# Patient Record
Sex: Female | Born: 1965 | Race: Black or African American | Hispanic: No | Marital: Single | Smoking: Never smoker
Health system: Southern US, Community
[De-identification: ages and names within clinical notes are randomized; demographics above are authoritative.]

## PROBLEM LIST (undated history)

## (undated) DIAGNOSIS — T7840XA Allergy, unspecified, initial encounter: Secondary | ICD-10-CM

## (undated) DIAGNOSIS — R519 Headache, unspecified: Secondary | ICD-10-CM

## (undated) DIAGNOSIS — R51 Headache: Principal | ICD-10-CM

## (undated) DIAGNOSIS — G8929 Other chronic pain: Secondary | ICD-10-CM

## (undated) DIAGNOSIS — J45909 Unspecified asthma, uncomplicated: Secondary | ICD-10-CM

## (undated) HISTORY — DX: Headache, unspecified: R51.9

## (undated) HISTORY — DX: Allergy, unspecified, initial encounter: T78.40XA

## (undated) HISTORY — PX: HERNIA REPAIR: SHX51

## (undated) HISTORY — DX: Headache: R51

## (undated) HISTORY — DX: Unspecified asthma, uncomplicated: J45.909

## (undated) HISTORY — PX: CHOLECYSTECTOMY: SHX55

## (undated) HISTORY — DX: Other chronic pain: G89.29

---

## 1998-04-30 ENCOUNTER — Other Ambulatory Visit: Admission: RE | Admit: 1998-04-30 | Discharge: 1998-04-30 | Payer: Self-pay | Admitting: Obstetrics and Gynecology

## 1998-08-26 ENCOUNTER — Encounter: Payer: Self-pay | Admitting: Emergency Medicine

## 1998-08-26 ENCOUNTER — Emergency Department (HOSPITAL_COMMUNITY): Admission: EM | Admit: 1998-08-26 | Discharge: 1998-08-26 | Payer: Self-pay | Admitting: Emergency Medicine

## 1998-09-10 ENCOUNTER — Emergency Department (HOSPITAL_COMMUNITY): Admission: EM | Admit: 1998-09-10 | Discharge: 1998-09-11 | Payer: Self-pay | Admitting: Emergency Medicine

## 1998-10-07 ENCOUNTER — Encounter: Payer: Self-pay | Admitting: *Deleted

## 1998-10-07 ENCOUNTER — Ambulatory Visit (HOSPITAL_COMMUNITY): Admission: RE | Admit: 1998-10-07 | Discharge: 1998-10-08 | Payer: Self-pay | Admitting: *Deleted

## 1999-05-09 ENCOUNTER — Other Ambulatory Visit: Admission: RE | Admit: 1999-05-09 | Discharge: 1999-05-09 | Payer: Self-pay | Admitting: Obstetrics and Gynecology

## 2001-07-07 ENCOUNTER — Other Ambulatory Visit: Admission: RE | Admit: 2001-07-07 | Discharge: 2001-07-07 | Payer: Self-pay | Admitting: Obstetrics and Gynecology

## 2009-04-05 LAB — HM PAP SMEAR: HM Pap smear: NORMAL

## 2009-04-05 LAB — HM MAMMOGRAPHY: HM Mammogram: NEGATIVE

## 2009-04-18 ENCOUNTER — Encounter: Admission: RE | Admit: 2009-04-18 | Discharge: 2009-04-18 | Payer: Self-pay | Admitting: Obstetrics and Gynecology

## 2010-08-12 ENCOUNTER — Inpatient Hospital Stay (INDEPENDENT_AMBULATORY_CARE_PROVIDER_SITE_OTHER)
Admission: RE | Admit: 2010-08-12 | Discharge: 2010-08-12 | Disposition: A | Discharge status: Home / Self Care | Payer: PRIVATE HEALTH INSURANCE | Source: Ambulatory Visit | Attending: Family Medicine | Admitting: Family Medicine

## 2010-08-12 DIAGNOSIS — R059 Cough, unspecified: Secondary | ICD-10-CM

## 2010-08-12 DIAGNOSIS — R05 Cough: Secondary | ICD-10-CM

## 2010-12-26 ENCOUNTER — Encounter: Payer: Self-pay | Admitting: Internal Medicine

## 2010-12-26 ENCOUNTER — Ambulatory Visit (INDEPENDENT_AMBULATORY_CARE_PROVIDER_SITE_OTHER): Payer: PRIVATE HEALTH INSURANCE | Admitting: Internal Medicine

## 2010-12-26 DIAGNOSIS — G8929 Other chronic pain: Secondary | ICD-10-CM | POA: Insufficient documentation

## 2010-12-26 DIAGNOSIS — R51 Headache: Secondary | ICD-10-CM

## 2010-12-26 DIAGNOSIS — J45909 Unspecified asthma, uncomplicated: Secondary | ICD-10-CM

## 2010-12-26 MED ORDER — HYDROCODONE-HOMATROPINE 5-1.5 MG/5ML PO SYRP: 5.0000 mL | ORAL_SOLUTION | Freq: Four times a day (QID) | ORAL | Status: AC | PRN

## 2010-12-26 NOTE — Progress Notes (Signed)
Subjective:    Patient ID: Morgan Smith, female    DOB: 1965-07-09, 45 y.o.   MRN: [id number]  HPI  45 year old patient who is seen today to establish with our practice. She enjoys excellent health. She does have a history of migraine headaches that are generally well controlled with ibuprofen. She also has a history of allergic rhinitis. No history of tobacco use. She does have a history of scoliosis and surgery in 1979. In 1986 she underwent surgery for scar revision. She's had 2 hernia surgeries as an infant. She is status post cholecystectomy in 2001 She was seen at the urgent care recently and treated with a prednisone Dosepak and antibiotic therapy for asthmatic bronchitis. A chest x-ray was normal. She has been scheduled to see an allergist in 3 days. Family history noncontributory details of her father's health unknown Mother  age 46 and in excellent health No siblings  Social history-married for 14 years presently and divorced. No children born and raised in [place]     Review of Systems  Constitutional: Negative for fever, appetite change, fatigue and unexpected weight change.  HENT: Negative for hearing loss, ear pain, nosebleeds, congestion, sore throat, mouth sores, trouble swallowing, neck stiffness, dental problem, voice change, sinus pressure and tinnitus.   Eyes: Negative for photophobia, pain, redness and visual disturbance.  Respiratory: Positive for cough. Negative for chest tightness and shortness of breath.   Cardiovascular: Negative for chest pain, palpitations and leg swelling.  Gastrointestinal: Negative for nausea, vomiting, abdominal pain, diarrhea, constipation, blood in stool, abdominal distention and rectal pain.  Genitourinary: Negative for dysuria, urgency, frequency, hematuria, flank pain, vaginal bleeding, vaginal discharge, difficulty urinating, genital sores, vaginal pain, menstrual problem and pelvic pain.  Musculoskeletal: Negative for back pain and  arthralgias.  Skin: Negative for rash.  Neurological: Negative for dizziness, syncope, speech difficulty, weakness, light-headedness, numbness and headaches.  Hematological: Negative for adenopathy. Does not bruise/bleed easily.  Psychiatric/Behavioral: Negative for suicidal ideas, behavioral problems, self-injury, dysphoric mood and agitation. The patient is not nervous/anxious.        Objective:   Physical Exam  Constitutional: She is oriented to person, place, and time. She appears well-developed and well-nourished.  HENT:  Head: Normocephalic and atraumatic.  Right Ear: External ear normal.  Left Ear: External ear normal.  Mouth/Throat: Oropharynx is clear and moist.  Eyes: Conjunctivae and EOM are normal.  Neck: Normal range of motion. Neck supple. No JVD present. No thyromegaly present.  Cardiovascular: Normal rate, regular rhythm, normal heart sounds and intact distal pulses.   No murmur heard. Pulmonary/Chest: Effort normal and breath sounds normal. She has no wheezes. She has no rales.       Slightly hoarse Chest is clear  Oxygen saturation 96%  Abdominal: Soft. Bowel sounds are normal. She exhibits no distension and no mass. There is no tenderness. There is no rebound and no guarding.  Musculoskeletal: Normal range of motion. She exhibits no edema and no tenderness.  Neurological: She is alert and oriented to person, place, and time. She has normal reflexes. No cranial nerve deficit. She exhibits normal muscle tone. Coordination normal.  Skin: Skin is warm and dry. No rash noted.  Psychiatric: She has a normal mood and affect. Her behavior is normal.          Assessment & Plan:  Resolving asthmatic bronchitis. Her most prominent symptom presently is cough we'll treat with an antitussives. She is scheduled for allergy followup in 3 days. Return here when necessary

## 2010-12-26 NOTE — Patient Instructions (Signed)
Use cough medicine as directed  Allergy followup as scheduled  Get plenty of rest, Drink lots of  clear liquids, and use Tylenol or ibuprofen for fever and discomfort.

## 2011-03-16 ENCOUNTER — Ambulatory Visit (INDEPENDENT_AMBULATORY_CARE_PROVIDER_SITE_OTHER): Payer: PRIVATE HEALTH INSURANCE | Admitting: Internal Medicine

## 2011-03-16 ENCOUNTER — Encounter: Payer: Self-pay | Admitting: Internal Medicine

## 2011-03-16 DIAGNOSIS — E559 Vitamin D deficiency, unspecified: Secondary | ICD-10-CM

## 2011-03-16 DIAGNOSIS — J45909 Unspecified asthma, uncomplicated: Secondary | ICD-10-CM

## 2011-03-16 MED ORDER — ERGOCALCIFEROL 1.25 MG (50000 UT) PO CAPS: 50000.0000 [IU] | ORAL_CAPSULE | ORAL | Status: AC

## 2011-03-16 NOTE — Progress Notes (Signed)
  Subjective:    Patient ID: Morgan Smith, female    DOB: [birth year], 45 y.o.   MRN: [id number]  HPI  45 year old patient who is seen today for followup she had laboratory studies performed that were to revealed a significant vitamin D deficiency. She is having some mild allergy related symptoms. She is followed by allergy and is being treated for chronic asthma. Denies any active wheezing. Laboratory studies were reviewed    Review of Systems  Constitutional: Negative.   HENT: Positive for congestion, rhinorrhea and postnasal drip. Negative for hearing loss, sore throat, dental problem, sinus pressure and tinnitus.   Eyes: Negative for pain, discharge and visual disturbance.  Respiratory: Negative for cough and shortness of breath.   Cardiovascular: Negative for chest pain, palpitations and leg swelling.  Gastrointestinal: Negative for nausea, vomiting, abdominal pain, diarrhea, constipation, blood in stool and abdominal distention.  Genitourinary: Negative for dysuria, urgency, frequency, hematuria, flank pain, vaginal bleeding, vaginal discharge, difficulty urinating, vaginal pain and pelvic pain.  Musculoskeletal: Negative for joint swelling, arthralgias and gait problem.  Skin: Negative for rash.  Neurological: Negative for dizziness, syncope, speech difficulty, weakness, numbness and headaches.  Hematological: Negative for adenopathy.  Psychiatric/Behavioral: Negative for behavioral problems, dysphoric mood and agitation. The patient is not nervous/anxious.        Objective:   Physical Exam  Constitutional: She is oriented to person, place, and time. She appears well-developed and well-nourished.  HENT:  Head: Normocephalic.  Right Ear: External ear normal.  Left Ear: External ear normal.  Mouth/Throat: Oropharynx is clear and moist.  Eyes: Conjunctivae and EOM are normal. Pupils are equal, round, and reactive to light.  Neck: Normal range of motion. Neck supple. No thyromegaly  present.  Cardiovascular: Normal rate, regular rhythm, normal heart sounds and intact distal pulses.   Pulmonary/Chest: Effort normal and breath sounds normal. No respiratory distress. She has no wheezes.       Chest is clear  Abdominal: Soft. Bowel sounds are normal. She exhibits no mass. There is no tenderness.  Musculoskeletal: Normal range of motion.  Lymphadenopathy:    She has no cervical adenopathy.  Neurological: She is alert and oriented to person, place, and time.  Skin: Skin is warm and dry. No rash noted.  Psychiatric: She has a normal mood and affect. Her behavior is normal.          Assessment & Plan:   Vitamin D deficiency. We'll treat with vitamin D 50,000 units weekly for 12 weeks then placed on daily vitamin D. She is asked to followup a vitamin D level in 4-6 months Asthma. Stable on Pulmicort

## 2011-03-16 NOTE — Patient Instructions (Signed)
Take a calcium supplement, plus 1500-2000  units of vitamin D    It is important that you exercise regularly, at least 20 minutes 3 to 4 times per week.  If you develop chest pain or shortness of breath seek  medical attention.

## 2014-02-04 ENCOUNTER — Ambulatory Visit (INDEPENDENT_AMBULATORY_CARE_PROVIDER_SITE_OTHER): Payer: PRIVATE HEALTH INSURANCE | Admitting: Family Medicine

## 2014-02-04 ENCOUNTER — Encounter: Payer: Self-pay | Admitting: Family Medicine

## 2014-02-04 ENCOUNTER — Ambulatory Visit (INDEPENDENT_AMBULATORY_CARE_PROVIDER_SITE_OTHER): Payer: PRIVATE HEALTH INSURANCE

## 2014-02-04 VITALS — BP 158/98 | HR 91 | Temp 98.0°F | Resp 18

## 2014-02-04 DIAGNOSIS — J4521 Mild intermittent asthma with (acute) exacerbation: Secondary | ICD-10-CM

## 2014-02-04 DIAGNOSIS — R05 Cough: Secondary | ICD-10-CM

## 2014-02-04 DIAGNOSIS — J45901 Unspecified asthma with (acute) exacerbation: Secondary | ICD-10-CM

## 2014-02-04 DIAGNOSIS — Z833 Family history of diabetes mellitus: Secondary | ICD-10-CM

## 2014-02-04 DIAGNOSIS — R059 Cough, unspecified: Secondary | ICD-10-CM

## 2014-02-04 DIAGNOSIS — R0602 Shortness of breath: Secondary | ICD-10-CM

## 2014-02-04 LAB — GLUCOSE, POCT (MANUAL RESULT ENTRY): POC Glucose: 100 mg/dl — AB (ref 70–99)

## 2014-02-04 MED ORDER — AZITHROMYCIN 250 MG PO TABS: ORAL_TABLET | ORAL | Status: DC

## 2014-02-04 MED ORDER — ALBUTEROL SULFATE (2.5 MG/3ML) 0.083% IN NEBU
2.5000 mg | INHALATION_SOLUTION | Freq: Once | RESPIRATORY_TRACT | Status: AC
  Administered 2014-02-04: 2.5 mg via RESPIRATORY_TRACT

## 2014-02-04 MED ORDER — PREDNISONE 20 MG PO TABS: 40.0000 mg | ORAL_TABLET | Freq: Every day | ORAL | Status: DC

## 2014-02-04 NOTE — Patient Instructions (Signed)
Start Zpak and prednisone for suspected asthmatic bronchitis with flair. Albuterol 1-2 puffs every 4-6 hours for wheezing, Can stop advair while you are taking the prednisone, then restart Advair twice per day everyday and recheck here or primary provider in few weeks to discuss medication regimen and possible spirometry (breathing test). Return to the clinic or go to the nearest emergency room if any of your symptoms worsen or new symptoms occur.  Acute Bronchitis Bronchitis is inflammation of the airways that extend from the windpipe into the lungs (bronchi). The inflammation often causes mucus to develop. This leads to a cough, which is the most common symptom of bronchitis.  In acute bronchitis, the condition usually develops suddenly and goes away over time, usually in a couple weeks. Smoking, allergies, and asthma can make bronchitis worse. Repeated episodes of bronchitis may cause further lung problems.  CAUSES Acute bronchitis is most often caused by the same virus that causes a cold. The virus can spread from person to person (contagious) through coughing, sneezing, and touching contaminated objects. SIGNS AND SYMPTOMS   Cough.   Fever.   Coughing up mucus.   Body aches.   Chest congestion.   Chills.   Shortness of breath.   Sore throat.  DIAGNOSIS  Acute bronchitis is usually diagnosed through a physical exam. Your health care provider will also ask you questions about your medical history. Tests, such as chest X-rays, are sometimes done to rule out other conditions.  TREATMENT  Acute bronchitis usually goes away in a couple weeks. Oftentimes, no medical treatment is necessary. Medicines are sometimes given for relief of fever or cough. Antibiotic medicines are usually not needed but may be prescribed in certain situations. In some cases, an inhaler may be recommended to help reduce shortness of breath and control the cough. A cool mist vaporizer may also be used to help  thin bronchial secretions and make it easier to clear the chest.  HOME CARE INSTRUCTIONS  Get plenty of rest.   Drink enough fluids to keep your urine clear or pale yellow (unless you have a medical condition that requires fluid restriction). Increasing fluids may help thin your respiratory secretions (sputum) and reduce chest congestion, and it will prevent dehydration.   Take medicines only as directed by your health care provider.  If you were prescribed an antibiotic medicine, finish it all even if you start to feel better.  Avoid smoking and secondhand smoke. Exposure to cigarette smoke or irritating chemicals will make bronchitis worse. If you are a smoker, consider using nicotine gum or skin patches to help control withdrawal symptoms. Quitting smoking will help your lungs heal faster.   Reduce the chances of another bout of acute bronchitis by washing your hands frequently, avoiding people with cold symptoms, and trying not to touch your hands to your mouth, nose, or eyes.   Keep all follow-up visits as directed by your health care provider.  SEEK MEDICAL CARE IF: Your symptoms do not improve after 1 week of treatment.  SEEK IMMEDIATE MEDICAL CARE IF:  You develop an increased fever or chills.   You have chest pain.   You have severe shortness of breath.  You have bloody sputum.   You develop dehydration.  You faint or repeatedly feel like you are going to pass out.  You develop repeated vomiting.  You develop a severe headache. MAKE SURE YOU:   Understand these instructions.  Will watch your condition.  Will get help right away if you are  not doing well or get worse. Document Released: 07/30/2004 Document Revised: 11/06/2013 Document Reviewed: 12/13/2012 [facility] Patient Information 2015 [place], [place]. This information is not intended to replace advice given to you by your health care provider. Make sure you discuss any questions you have with your health  care provider.

## 2014-02-04 NOTE — Progress Notes (Addendum)
This chart was scribed for Morgan StaggersJeffrey Nyisha Clippard, MD by Morgan Smith, ED Scribe. This patient was seen in room 7 and the patient's care was started at 2:29 PM. Subjective:    Patient ID: Morgan Smith, female    DOB: 10-May-1966, 48 y.o.   MRN: [id number]  Shortness of Breath   Chief Complaint  Patient presents with  . Shortness of Breath   HPI Comments: Morgan Smith is a 48 y.o. female who presents to the [facility] complaining of worsening SOB x 1 day ago. Pt pulled acutely from waiting room due to SOB and triager her pulled me to see pt due to breathing difficulties.  Pt states symptoms began 1 day ago with SOB, wheezing and a productive cough consisting clear mucous. Pt used advair 4 times yesterday with relief to symptoms. She reports gradually worsening symptom throughout the day today. She did not use inhaler trying to work through her symptoms. Pt denies night time symptoms throughout the week. Pt has been using advair for 4 year as needed. Pt uses this medication a couple times a week for SOB due to exertion while at work constantly running up and down stair.  Pt denies using albuterol inhaler yesterday. Pt does not use albuterol throughout the week.  Pt denies CP and heart problems. Pt denies leg swelling, abdominal pain and fever.Pt denies being a smoker.    Patient Active Problem List   Diagnosis Date Noted  . Vitamin D deficiency 03/16/2011  . Asthma 03/16/2011  . Chronic headaches 12/26/2010   Past Medical History  Diagnosis Date  . Chronic headaches   . Allergy    Past Surgical History  Procedure Laterality Date  . Cholecystectomy    . Hernia repair      infant   No Known Allergies Prior to Admission medications   Medication Sig Start Date End Date Taking? Authorizing Provider  ADVAIR DISKUS 250-50 MCG/DOSE AEPB Take 1 puff by mouth daily.  02/13/11   Historical Provider, MD  pantoprazole (PROTONIX) 40 MG tablet Take 40 mg by mouth daily.  02/12/11    Historical Provider, MD  PATANASE 0.6 % SOLN  12/29/10   Historical Provider, MD  PULMICORT FLEXHALER 180 MCG/ACT inhaler Inhale 2 puffs into the lungs 2 (two) times daily.  12/15/10   Historical Provider, MD  VENTOLIN HFA 108 (90 BASE) MCG/ACT inhaler Inhale 2 puffs into the lungs every 4 (four) hours as needed.  12/15/10   Historical Provider, MD   History   Social History  . Marital Status: Married    Spouse Name: N/A    Number of Children: N/A  . Years of Education: N/A   Occupational History  . Not on file.   Social History Main Topics  . Smoking status: Never Smoker   . Smokeless tobacco: Never Used  . Alcohol Use: Yes  . Drug Use: No  . Sexual Activity: Not on file   Other Topics Concern  . Not on file   Social History Narrative   Married 14 years they're presently divorced no children;  [place]    Review of Systems  Respiratory: Positive for shortness of breath.    Objective:   Physical Exam  Nursing note and vitals reviewed. Constitutional: She is oriented to person, place, and time. She appears well-developed and well-nourished. No distress.  HENT:  Head: Normocephalic and atraumatic.  Mouth/Throat: Oropharynx is clear and moist.  Edema over the turbinates bilaterally.  Eyes: Conjunctivae and EOM are normal.  Neck: Neck supple.  Cardiovascular: Normal rate.   Pulmonary/Chest: Effort normal. She has wheezes.  Distant appearing breath sounds with diffuse wheeze.   Musculoskeletal: Normal range of motion.  Neurological: She is alert and oriented to person, place, and time.  Skin: Skin is warm and dry.  Psychiatric: She has a normal mood and affect. Her behavior is normal.   Filed Vitals:   02/04/14 1428  BP: 158/98  Pulse: 91  Temp: 98 F (36.7 C)  TempSrc: Oral  Resp: 18  SpO2: 94%   3:27 PM- repeat exam after albuterol 2.5 neb. Improved aeration and work of breathing but still distant wheeze right greater than left with dyspnea  and tightness with cough.   [facility] reading (PRIMARY) by  Dr. Neva Seat: CXR: increased bronchial markings without discrete infiltrate.  Harrington rod in place.  Results for orders placed in visit on 02/04/14  GLUCOSE, POCT (MANUAL RESULT ENTRY)      Result Value Ref Range   POC Glucose 100 (*) 70 - 99 mg/dl    Assessment & Plan:   Morgan Smith is a 48 y.o. female Asthma, mild intermittent, with acute exacerbation - Plan: albuterol (PROVENTIL) (2.5 MG/3ML) 0.083% nebulizer solution 2.5 mg, DG Chest 2 View, albuterol (PROVENTIL) (2.5 MG/3ML) 0.083% nebulizer solution 2.5 mg  SOB (shortness of breath) - Plan: albuterol (PROVENTIL) (2.5 MG/3ML) 0.083% nebulizer solution 2.5 mg, DG Chest 2 View, albuterol (PROVENTIL) (2.5 MG/3ML) 0.083% nebulizer solution 2.5 mg  Cough - Plan: albuterol (PROVENTIL) (2.5 MG/3ML) 0.083% nebulizer solution 2.5 mg  Family history of type II diabetes mellitus - Plan: POCT glucose (manual entry)  Asthmatic bronchitis with acute exacerbation - Plan: predniSONE (DELTASONE) 20 MG tablet, azithromycin (ZITHROMAX) 250 MG tablet  Acutely seen d/t dyspnea.  Started on albuterol neb, improved after 2 nebulizer treatments. By sx's appears to have mild persistent asthma by prior sx's but intermittent dosing of Advair. Improved in office. asthmatic bronchitis treated with Zpak, prednisone 40mg  qd for 5 days. Albuterol inh at home as needed, then restart Advair after prednisone. Discussed daily use of this and prn albuterol. May be able to change to ICS only if QD dosing, but can assess this in few weeks. rtc precautions.   Meds ordered this encounter  Medications  . albuterol (PROVENTIL) (2.5 MG/3ML) 0.083% nebulizer solution 2.5 mg    Sig:   . albuterol (PROVENTIL) (2.5 MG/3ML) 0.083% nebulizer solution 2.5 mg    Sig:   . predniSONE (DELTASONE) 20 MG tablet    Sig: Take 2 tablets (40 mg total) by mouth daily with breakfast.    Dispense:  10 tablet    Refill:  0  .  azithromycin (ZITHROMAX) 250 MG tablet    Sig: Take 2 pills by mouth on day 1, then 1 pill by mouth per day on days 2 through 5.    Dispense:  5 tablet    Refill:  0   Patient Instructions  Start Zpak and prednisone for suspected asthmatic bronchitis with flair. Albuterol 1-2 puffs every 4-6 hours for wheezing, Can stop advair while you are taking the prednisone, then restart Advair twice per day everyday and recheck here or primary provider in few weeks to discuss medication regimen and possible spirometry (breathing test). Return to the clinic or go to the nearest emergency room if any of your symptoms worsen or new symptoms occur.  Acute Bronchitis Bronchitis is inflammation of the airways that extend from the windpipe  into the lungs (bronchi). The inflammation often causes mucus to develop. This leads to a cough, which is the most common symptom of bronchitis.  In acute bronchitis, the condition usually develops suddenly and goes away over time, usually in a couple weeks. Smoking, allergies, and asthma can make bronchitis worse. Repeated episodes of bronchitis may cause further lung problems.  CAUSES Acute bronchitis is most often caused by the same virus that causes a cold. The virus can spread from person to person (contagious) through coughing, sneezing, and touching contaminated objects. SIGNS AND SYMPTOMS   Cough.   Fever.   Coughing up mucus.   Body aches.   Chest congestion.   Chills.   Shortness of breath.   Sore throat.  DIAGNOSIS  Acute bronchitis is usually diagnosed through a physical exam. Your health care provider will also ask you questions about your medical history. Tests, such as chest X-rays, are sometimes done to rule out other conditions.  TREATMENT  Acute bronchitis usually goes away in a couple weeks. Oftentimes, no medical treatment is necessary. Medicines are sometimes given for relief of fever or cough. Antibiotic medicines are usually not needed  but may be prescribed in certain situations. In some cases, an inhaler may be recommended to help reduce shortness of breath and control the cough. A cool mist vaporizer may also be used to help thin bronchial secretions and make it easier to clear the chest.  HOME CARE INSTRUCTIONS  Get plenty of rest.   Drink enough fluids to keep your urine clear or pale yellow (unless you have a medical condition that requires fluid restriction). Increasing fluids may help thin your respiratory secretions (sputum) and reduce chest congestion, and it will prevent dehydration.   Take medicines only as directed by your health care provider.  If you were prescribed an antibiotic medicine, finish it all even if you start to feel better.  Avoid smoking and secondhand smoke. Exposure to cigarette smoke or irritating chemicals will make bronchitis worse. If you are a smoker, consider using nicotine gum or skin patches to help control withdrawal symptoms. Quitting smoking will help your lungs heal faster.   Reduce the chances of another bout of acute bronchitis by washing your hands frequently, avoiding people with cold symptoms, and trying not to touch your hands to your mouth, nose, or eyes.   Keep all follow-up visits as directed by your health care provider.  SEEK MEDICAL CARE IF: Your symptoms do not improve after 1 week of treatment.  SEEK IMMEDIATE MEDICAL CARE IF:  You develop an increased fever or chills.   You have chest pain.   You have severe shortness of breath.  You have bloody sputum.   You develop dehydration.  You faint or repeatedly feel like you are going to pass out.  You develop repeated vomiting.  You develop a severe headache. MAKE SURE YOU:   Understand these instructions.  Will watch your condition.  Will get help right away if you are not doing well or get worse. Document Released: 07/30/2004 Document Revised: 11/06/2013 Document Reviewed: 12/13/2012 [facility]  Patient Information 2015 [place], [place]. This information is not intended to replace advice given to you by your health care provider. Make sure you discuss any questions you have with your health care provider.    I personally performed the services described in this documentation, which was scribed in my presence. The recorded information has been reviewed and considered, and addended by me as needed.

## 2014-02-06 ENCOUNTER — Encounter: Payer: Self-pay | Admitting: Family Medicine

## 2014-06-07 ENCOUNTER — Ambulatory Visit
Admission: RE | Admit: 2014-06-07 | Discharge: 2014-06-07 | Disposition: A | Discharge status: Home / Self Care | Payer: Commercial Managed Care - PPO | Source: Ambulatory Visit | Attending: Allergy and Immunology | Admitting: Allergy and Immunology

## 2014-06-07 ENCOUNTER — Other Ambulatory Visit: Payer: Self-pay | Admitting: Allergy and Immunology

## 2014-06-07 DIAGNOSIS — J329 Chronic sinusitis, unspecified: Secondary | ICD-10-CM

## 2015-05-10 ENCOUNTER — Other Ambulatory Visit: Payer: Self-pay | Admitting: Adult Health

## 2015-05-10 ENCOUNTER — Ambulatory Visit (INDEPENDENT_AMBULATORY_CARE_PROVIDER_SITE_OTHER): Payer: Commercial Managed Care - PPO | Admitting: Adult Health

## 2015-05-10 ENCOUNTER — Ambulatory Visit (INDEPENDENT_AMBULATORY_CARE_PROVIDER_SITE_OTHER)
Admission: RE | Admit: 2015-05-10 | Discharge: 2015-05-10 | Disposition: A | Discharge status: Home / Self Care | Payer: Commercial Managed Care - PPO | Source: Ambulatory Visit | Attending: Adult Health | Admitting: Adult Health

## 2015-05-10 ENCOUNTER — Encounter: Payer: Self-pay | Admitting: Adult Health

## 2015-05-10 ENCOUNTER — Telehealth: Payer: Self-pay | Admitting: Adult Health

## 2015-05-10 VITALS — BP 120/78 | HR 90 | Temp 98.3°F | Ht 64.5 in | Wt 169.0 lb

## 2015-05-10 DIAGNOSIS — R059 Cough, unspecified: Secondary | ICD-10-CM

## 2015-05-10 DIAGNOSIS — R05 Cough: Secondary | ICD-10-CM | POA: Diagnosis not present

## 2015-05-10 MED ORDER — PREDNISONE 20 MG PO TABS: 20.0000 mg | ORAL_TABLET | Freq: Every day | ORAL | Status: DC

## 2015-05-10 MED ORDER — DOXYCYCLINE HYCLATE 100 MG PO CAPS: 100.0000 mg | ORAL_CAPSULE | Freq: Two times a day (BID) | ORAL | Status: DC

## 2015-05-10 MED ORDER — HYDROCODONE-HOMATROPINE 5-1.5 MG/5ML PO SYRP: 5.0000 mL | ORAL_SOLUTION | Freq: Three times a day (TID) | ORAL | Status: DC | PRN

## 2015-05-10 NOTE — Patient Instructions (Signed)
It was great meeting you. I am sorry you are feeling horrible.   I will follow up with you regarding your chest xray   Use the cough syrup during the night as it will make you sleepy.

## 2015-05-10 NOTE — Progress Notes (Signed)
Pre visit review using our clinic review tool, if applicable. No additional management support is needed unless otherwise documented below in the visit note. 

## 2015-05-10 NOTE — Progress Notes (Signed)
Subjective:    Patient ID: Morgan Smith, female    DOB: October 29, 1965, 49 y.o.   MRN: [id number]  HPI  49 year old female who presents to the office for 2 weeks of cough and congestion. The first week she had a sinus infection by that weekend she started to have a semi-productive cough and subjective fever. When she got to her place of work on Monday she went and saw a NP who prescribed her a zpak and prednisone. She is currently taking 10 mg per day 7 days. She has one more day of a z-pak. She is not feeling any better and continues to have a cough.   She also feels like she is losing her voice.   She is also using her Breo Ellipta and Advair with minimal relief.   Review of Systems  Constitutional: Negative.   HENT: Positive for congestion, postnasal drip and sinus pressure. Negative for rhinorrhea and sore throat.   Respiratory: Positive for cough, shortness of breath and wheezing.   Cardiovascular: Positive for chest pain.  Neurological: Negative.   Hematological: Negative for adenopathy.  Psychiatric/Behavioral: Positive for sleep disturbance.  All other systems reviewed and are negative.  Past Medical History  Diagnosis Date  . Chronic headaches   . Allergy     Social History   Social History  . Marital Status: Married    Spouse Name: N/A  . Number of Children: N/A  . Years of Education: N/A   Occupational History  . Not on file.   Social History Main Topics  . Smoking status: Never Smoker   . Smokeless tobacco: Never Used  . Alcohol Use: Yes  . Drug Use: No  . Sexual Activity: Not on file   Other Topics Concern  . Not on file   Social History Narrative   Married 14 years they're presently divorced no children;  [place]    Past Surgical History  Procedure Laterality Date  . Cholecystectomy    . Hernia repair      infant    No family history on file.  No Known Allergies  Current Outpatient Prescriptions on File Prior to Visit   Medication Sig Dispense Refill  . ADVAIR DISKUS 250-50 MCG/DOSE AEPB Take 1 puff by mouth daily.     Marland Kitchen. azithromycin (ZITHROMAX) 250 MG tablet Take 2 pills by mouth on day 1, then 1 pill by mouth per day on days 2 through 5. 5 tablet 0  . pantoprazole (PROTONIX) 40 MG tablet Take 40 mg by mouth daily.     Marland Kitchen. PATANASE 0.6 % SOLN      No current facility-administered medications on file prior to visit.    BP 120/78 mmHg  Pulse 90  Temp(Src) 98.3 F (36.8 C) (Oral)  Ht 5' 4.5" (1.638 m)  Wt 169 lb (76.658 kg)  BMI 28.57 kg/m2  SpO2 94%       Objective:   Physical Exam  Constitutional: She is oriented to person, place, and time. She appears well-developed and well-nourished. No distress.  Cardiovascular: Normal rate, regular rhythm, normal heart sounds and intact distal pulses.  Exam reveals no gallop and no friction rub.   No murmur heard. Pulmonary/Chest: Effort normal. No respiratory distress. She has wheezes (and rhonchi). She exhibits tenderness.  Lymphadenopathy:    She has cervical adenopathy.  Neurological: She is alert and oriented to person, place, and time.  Skin: Skin is warm and dry. No rash noted. She  is not diaphoretic. No erythema. No pallor.  Psychiatric: She has a normal mood and affect. Her behavior is normal. Judgment and thought content normal.  Nursing note and vitals reviewed.      Assessment & Plan:  1. Cough - HYDROcodone-homatropine (HYCODAN) 5-1.5 MG/5ML syrup; Take 5 mLs by mouth every 8 (eight) hours as needed for cough.  Dispense: 120 mL; Refill: 0 - DG Chest 2 View; Future - Consider increasing Prednisone and changing ABX. Will see what chest xray shows first to r/o PNA

## 2015-05-10 NOTE — Telephone Encounter (Signed)
Spoke to patient on the phone and informed her of her x ray results. Will do a prednisone burst of 60mg  x 6 days and Doxycycline 100mg 

## 2021-01-09 ENCOUNTER — Other Ambulatory Visit: Payer: Self-pay

## 2021-01-10 ENCOUNTER — Encounter: Payer: Self-pay | Admitting: Internal Medicine

## 2021-01-10 ENCOUNTER — Ambulatory Visit: Payer: Managed Care, Other (non HMO) | Admitting: Internal Medicine

## 2021-01-10 VITALS — BP 130/96 | HR 89 | Temp 98.8°F | Ht 66.0 in | Wt 194.5 lb

## 2021-01-10 DIAGNOSIS — J4540 Moderate persistent asthma, uncomplicated: Secondary | ICD-10-CM | POA: Diagnosis not present

## 2021-01-10 DIAGNOSIS — E559 Vitamin D deficiency, unspecified: Secondary | ICD-10-CM | POA: Diagnosis not present

## 2021-01-10 DIAGNOSIS — R030 Elevated blood-pressure reading, without diagnosis of hypertension: Secondary | ICD-10-CM | POA: Diagnosis not present

## 2021-01-10 DIAGNOSIS — Z1231 Encounter for screening mammogram for malignant neoplasm of breast: Secondary | ICD-10-CM

## 2021-01-10 DIAGNOSIS — Z1211 Encounter for screening for malignant neoplasm of colon: Secondary | ICD-10-CM

## 2021-01-10 NOTE — Patient Instructions (Signed)
-  Nice seeing you today!!  -Check blood pressure 3 times a week and bring measurements into your next visit.  -Mammogram and colonoscopy to be scheduled.  -You are due for the following vaccines: pneumonia, tetanus, shingles. Information provided today.  -Schedule follow up in 3 months for your physical. Please come in fasting that day. Pap smear will be done then.

## 2021-01-10 NOTE — Progress Notes (Signed)
New Patient Office Visit     This visit occurred during the SARS-CoV-2 public health emergency.  Safety protocols were in place, including screening questions prior to the visit, additional usage of staff PPE, and extensive cleaning of exam room while observing appropriate contact time as indicated for disinfecting solutions.    CC/Reason for Visit: Establish care, discuss acute and chronic concerns Previous PCP: None Last Visit: Unknown  HPI: Morgan Smith is a 55 y.o. female who is coming in today for the above mentioned reasons. Past Medical History is significant for: Asthma followed by the [facility].  She has not had routine medical care in years.  She has been using the urgent care as her PCP.  She does not smoke, she drinks alcohol only occasionally, she has no known drug allergies.  Her past surgical history significant for cholecystectomy, had scoliosis surgery as a teenager.  Her family history significant for hypertension in her maternal grandmother and maternal aunt.  She is overdue for all forms of cancer screening.  She has had 2 COVID vaccines but is otherwise overdue for everything else.  She declines immunizations today.   Past Medical/Surgical History: Past Medical History:  Diagnosis Date   Allergy    Asthma    Chronic headaches     Past Surgical History:  Procedure Laterality Date   CHOLECYSTECTOMY     HERNIA REPAIR     infant    Social History:  reports that she has never smoked. She has never used smokeless tobacco. She reports current alcohol use. She reports that she does not use drugs.  Allergies: No Known Allergies  Family History:  Family History  Problem Relation Age of Onset   Hypertension Maternal Grandmother    Hypertension Maternal Aunt      Current Outpatient Medications:    albuterol (VENTOLIN HFA) 108 (90 Base) MCG/ACT inhaler, Inhale into the lungs every 6 (six) hours as needed for wheezing or shortness of  breath., Disp: , Rfl:    azelastine (ASTELIN) 0.1 % nasal spray, Place into both nostrils 2 (two) times daily. Use in each nostril as directed, Disp: , Rfl:    BREO ELLIPTA 200-25 MCG/INH AEPB, INL 1 PUFF PO QD, Disp: , Rfl: 5   desloratadine (CLARINEX) 5 MG tablet, Take 5 mg by mouth daily., Disp: , Rfl:    Magnesium 250 MG TABS, Take by mouth. daily, Disp: , Rfl:    OVER THE COUNTER MEDICATION, Vitamin D3 25 mcg daily, Disp: , Rfl:    OVER THE COUNTER MEDICATION, Cranberry with vitamin C 62952 mg daily, Disp: , Rfl:    predniSONE (DELTASONE) 10 MG tablet, Take 10 mg by mouth daily with breakfast., Disp: , Rfl:   Review of Systems:  Constitutional: Denies fever, chills, diaphoresis, appetite change and fatigue.  HEENT: Denies photophobia, eye pain, redness, hearing loss, ear pain, congestion, sore throat, rhinorrhea, sneezing, mouth sores, trouble swallowing, neck pain, neck stiffness and tinnitus.   Respiratory: Denies SOB, DOE, cough, chest tightness,  and wheezing.   Cardiovascular: Denies chest pain, palpitations and leg swelling.  Gastrointestinal: Denies nausea, vomiting, abdominal pain, diarrhea, constipation, blood in stool and abdominal distention.  Genitourinary: Denies dysuria, urgency, frequency, hematuria, flank pain and difficulty urinating.  Endocrine: Denies: hot or cold intolerance, sweats, changes in hair or nails, polyuria, polydipsia. Musculoskeletal: Denies myalgias, back pain, joint swelling, arthralgias and gait problem.  Skin: Denies pallor, rash and wound.  Neurological: Denies dizziness, seizures, syncope,  weakness, light-headedness, numbness and headaches.  Hematological: Denies adenopathy. Easy bruising, personal or family bleeding history  Psychiatric/Behavioral: Denies suicidal ideation, mood changes, confusion, nervousness, sleep disturbance and agitation    Physical Exam: Vitals:   01/10/21 0843  BP: (!) 130/96  Pulse: 89  Temp: 98.8 F (37.1 C)   TempSrc: Oral  SpO2: 98%  Weight: 194 lb 8 oz (88.2 kg)  Height: 5\' 6"  (1.676 m)   Body mass index is 31.39 kg/m.  Constitutional: NAD, calm, comfortable Eyes: PERRL, lids and conjunctivae normal ENMT: Mucous membranes are moist.  Respiratory: clear to auscultation bilaterally, no wheezing, no crackles. Normal respiratory effort. No accessory muscle use.  Cardiovascular: Regular rate and rhythm, no murmurs / rubs / gallops. No extremity edema.  Neurologic: Grossly intact and nonfocal Psychiatric: Normal judgment and insight. Alert and oriented x 3. Normal mood.    Impression and Plan:  Moderate persistent asthma without complication -Fairly well controlled, followed by an allergist.  Vitamin D deficiency -Recheck vitamin D levels when she returns for CPE.  Elevated BP without diagnosis of hypertension -In office blood pressure noted to be 130/96, she tells me that she has always been told that her blood pressure "runs high". -Have advised ambulatory blood pressure monitoring and follow-up in 3 months.  Encounter for screening mammogram for malignant neoplasm of breast  - Plan: MM Digital Screening  Colon cancer screening  - Plan: Ambulatory referral to Gastroenterology  Time spent: 40 minutes reviewing charts, interviewing and examining patient, counseling on required cancer screenings and immunizations, formulating plan of care.    Patient Instructions  -Nice seeing you today!!  -Check blood pressure 3 times a week and bring measurements into your next visit.  -Mammogram and colonoscopy to be scheduled.  -You are due for the following vaccines: pneumonia, tetanus, shingles. Information provided today.  -Schedule follow up in 3 months for your physical. Please come in fasting that day. Pap smear will be done then.   , MD [facility]

## 2021-02-28 LAB — COMPREHENSIVE METABOLIC PANEL: GFR calc Af Amer: 71

## 2021-02-28 LAB — BASIC METABOLIC PANEL: Creatinine: 0.9 (ref 0.5–1.1)

## 2021-02-28 LAB — LIPID PANEL
Cholesterol: 244 — AB (ref 0–200)
LDL Cholesterol: 152

## 2021-02-28 LAB — TSH: TSH: 2.19 (ref 0.41–5.90)

## 2021-02-28 LAB — CBC AND DIFFERENTIAL: Hemoglobin: 14 (ref 12.0–16.0)

## 2021-02-28 LAB — VITAMIN D 25 HYDROXY (VIT D DEFICIENCY, FRACTURES): Vit D, 25-Hydroxy: 35

## 2021-03-13 ENCOUNTER — Other Ambulatory Visit: Payer: Self-pay

## 2021-03-13 ENCOUNTER — Ambulatory Visit
Admission: RE | Admit: 2021-03-13 | Discharge: 2021-03-13 | Disposition: A | Discharge status: Home / Self Care | Payer: Managed Care, Other (non HMO) | Source: Ambulatory Visit | Attending: Internal Medicine | Admitting: Internal Medicine

## 2021-03-13 DIAGNOSIS — Z1231 Encounter for screening mammogram for malignant neoplasm of breast: Secondary | ICD-10-CM

## 2021-04-23 ENCOUNTER — Other Ambulatory Visit: Payer: Self-pay

## 2021-04-24 ENCOUNTER — Encounter: Payer: Self-pay | Admitting: Internal Medicine

## 2021-04-24 ENCOUNTER — Ambulatory Visit (INDEPENDENT_AMBULATORY_CARE_PROVIDER_SITE_OTHER): Payer: Managed Care, Other (non HMO) | Admitting: Internal Medicine

## 2021-04-24 VITALS — BP 140/100 | HR 82 | Temp 98.5°F | Wt 193.6 lb

## 2021-04-24 DIAGNOSIS — I10 Essential (primary) hypertension: Secondary | ICD-10-CM | POA: Insufficient documentation

## 2021-04-24 DIAGNOSIS — E785 Hyperlipidemia, unspecified: Secondary | ICD-10-CM | POA: Insufficient documentation

## 2021-04-24 DIAGNOSIS — E782 Mixed hyperlipidemia: Secondary | ICD-10-CM | POA: Diagnosis not present

## 2021-04-24 DIAGNOSIS — N182 Chronic kidney disease, stage 2 (mild): Secondary | ICD-10-CM

## 2021-04-24 DIAGNOSIS — E669 Obesity, unspecified: Secondary | ICD-10-CM | POA: Diagnosis not present

## 2021-04-24 NOTE — Progress Notes (Signed)
Established Patient Office Visit     This visit occurred during the SARS-CoV-2 public health emergency.  Safety protocols were in place, including screening questions prior to the visit, additional usage of staff PPE, and extensive cleaning of exam room while observing appropriate contact time as indicated for disinfecting solutions.    CC/Reason for Visit: Follow-up chronic medical conditions  HPI: Morgan Smith is a 55 y.o. female who is coming in today for the above mentioned reasons. Past Medical History is significant for: Asthma.  She had not had routine medical care in years other than a nurse that she sees at her place of employment.  When I first met her in July she had mentioned that at times her blood pressure had been elevated but she attributed it to stress.  At that visit her blood pressure was 130/96.  She has continued to note elevated blood pressures at home.  She has also seen the nurse at work who measured her blood pressure at 130/90.  Blood pressure in office today is 140/100.  She is hesitant to start medications, she believes that she can lower her blood pressure with lifestyle changes and stress management.  She shares lab work that she had at her place of employment in August of this year that had a total cholesterol of 244, LDL 152, creatinine is 0.94 for a GFR of 71, A1c was 5.5.   Past Medical/Surgical History: Past Medical History:  Diagnosis Date   Allergy    Asthma    Chronic headaches     Past Surgical History:  Procedure Laterality Date   CHOLECYSTECTOMY     HERNIA REPAIR     infant    Social History:  reports that she has never smoked. She has never used smokeless tobacco. She reports current alcohol use. She reports that she does not use drugs.  Allergies: No Known Allergies  Family History:  Family History  Problem Relation Age of Onset   Hypertension Maternal Aunt    Hypertension Maternal Grandmother    Breast cancer Neg Hx       Current Outpatient Medications:    albuterol (VENTOLIN HFA) 108 (90 Base) MCG/ACT inhaler, Inhale into the lungs every 6 (six) hours as needed for wheezing or shortness of breath., Disp: , Rfl:    azelastine (ASTELIN) 0.1 % nasal spray, Place into both nostrils 2 (two) times daily. Use in each nostril as directed, Disp: , Rfl:    BREO ELLIPTA 200-25 MCG/INH AEPB, INL 1 PUFF PO QD, Disp: , Rfl: 5   desloratadine (CLARINEX) 5 MG tablet, Take 5 mg by mouth daily., Disp: , Rfl:    Magnesium 250 MG TABS, Take by mouth. daily, Disp: , Rfl:    OVER THE COUNTER MEDICATION, Vitamin D3 25 mcg daily, Disp: , Rfl:    OVER THE COUNTER MEDICATION, Cranberry with vitamin C 15000 mg daily, Disp: , Rfl:   Review of Systems:  Constitutional: Denies fever, chills, diaphoresis, appetite change and fatigue.  HEENT: Denies photophobia, eye pain, redness, hearing loss, ear pain, congestion, sore throat, rhinorrhea, sneezing, mouth sores, trouble swallowing, neck pain, neck stiffness and tinnitus.   Respiratory: Denies SOB, DOE, cough, chest tightness,  and wheezing.   Cardiovascular: Denies chest pain, palpitations and leg swelling.  Gastrointestinal: Denies nausea, vomiting, abdominal pain, diarrhea, constipation, blood in stool and abdominal distention.  Genitourinary: Denies dysuria, urgency, frequency, hematuria, flank pain and difficulty urinating.  Endocrine: Denies: hot or cold intolerance, sweats, changes  in hair or nails, polyuria, polydipsia. Musculoskeletal: Denies myalgias, back pain, joint swelling, arthralgias and gait problem.  Skin: Denies pallor, rash and wound.  Neurological: Denies dizziness, seizures, syncope, weakness, light-headedness, numbness and headaches.  Hematological: Denies adenopathy. Easy bruising, personal or family bleeding history  Psychiatric/Behavioral: Denies suicidal ideation, mood changes, confusion, nervousness, sleep disturbance and agitation    Physical  Exam: Vitals:   04/24/21 1026  BP: (!) 140/100  Pulse: 82  Temp: 98.5 F (36.9 C)  TempSrc: Oral  SpO2: 95%  Weight: 193 lb 9.6 oz (87.8 kg)    Body mass index is 31.25 kg/m.   Constitutional: NAD, calm, comfortable Eyes: PERRL, lids and conjunctivae normal ENMT: Mucous membranes are moist.  Respiratory: clear to auscultation bilaterally, no wheezing, no crackles. Normal respiratory effort. No accessory muscle use.  Cardiovascular: Regular rate and rhythm, no murmurs / rubs / gallops. No extremity edema.  Neurologic: Grossly intact and nonfocal Psychiatric: Normal judgment and insight. Alert and oriented x 3. Normal mood.    Impression and Plan:  Primary hypertension -I have strongly advised that we start antihypertensive therapy today, she would like to work on lifestyle changes before. -Have advised blood pressure monitoring at home and follow-up in 3 months.  Mixed hyperlipidemia -She will work on lifestyle changes and return in 3 months for repeat labs.  Obesity (BMI 30.0-34.9) -Discussed healthy lifestyle, including increased physical activity and better food choices to promote weight loss.  CKD (chronic kidney disease) stage 2, GFR 60-89 ml/min -Noted, follow with repeat labs in 3 months, consider treatment of hypertension.  Time spent: 34 minutes reviewing chart, interviewing and examining patient, educating patient on importance of reducing cardiovascular disease risk factors.     Lelon Frohlich, MD [facility]

## 2021-06-24 LAB — LIPID PANEL
Cholesterol: 217 — AB (ref 0–200)
HDL: 72 — AB (ref 35–70)
LDL Cholesterol: 128

## 2021-07-28 ENCOUNTER — Ambulatory Visit: Payer: Managed Care, Other (non HMO) | Admitting: Internal Medicine

## 2021-07-28 VITALS — BP 138/96 | HR 66 | Temp 98.7°F | Wt 188.8 lb

## 2021-07-28 DIAGNOSIS — E782 Mixed hyperlipidemia: Secondary | ICD-10-CM | POA: Diagnosis not present

## 2021-07-28 DIAGNOSIS — I10 Essential (primary) hypertension: Secondary | ICD-10-CM | POA: Diagnosis not present

## 2021-07-28 MED ORDER — HYDROCHLOROTHIAZIDE 25 MG PO TABS: 25.0000 mg | ORAL_TABLET | Freq: Every day | ORAL | 1 refills | Status: AC

## 2021-07-28 MED ORDER — LOSARTAN POTASSIUM 50 MG PO TABS: 50.0000 mg | ORAL_TABLET | Freq: Every day | ORAL | 1 refills | Status: DC

## 2021-07-28 NOTE — Progress Notes (Signed)
Established Patient Office Visit     This visit occurred during the SARS-CoV-2 public health emergency.  Safety protocols were in place, including screening questions prior to the visit, additional usage of staff PPE, and extensive cleaning of exam room while observing appropriate contact time as indicated for disinfecting solutions.    CC/Reason for Visit: Follow-up chronic conditions  HPI: Morgan Smith is a 56 y.o. female who is coming in today for the above mentioned reasons. Past Medical History is significant for: Asthma, hypertension, hyperlipidemia.  It appears that she was started on hydrochlorothiazide 25 mg by her nurse practitioner at work.  Blood pressure, while improved remains elevated.  2 separate measurements in office today are 142/100 and 138/96.  She had a recent lipid panel at work as well that showed a total cholesterol of 217 with an LDL of 128 in December which is improved from an LDL of 152 in August.  She is not on medications, she has lost about 10 pounds since last visit.  She is overdue for flu vaccine but declines today.   Past Medical/Surgical History: Past Medical History:  Diagnosis Date   Allergy    Asthma    Chronic headaches     Past Surgical History:  Procedure Laterality Date   CHOLECYSTECTOMY     HERNIA REPAIR     infant    Social History:  reports that she has never smoked. She has never used smokeless tobacco. She reports current alcohol use. She reports that she does not use drugs.  Allergies: No Known Allergies  Family History:  Family History  Problem Relation Age of Onset   Hypertension Maternal Aunt    Hypertension Maternal Grandmother    Breast cancer Neg Hx      Current Outpatient Medications:    albuterol (VENTOLIN HFA) 108 (90 Base) MCG/ACT inhaler, Inhale into the lungs every 6 (six) hours as needed for wheezing or shortness of breath., Disp: , Rfl:    azelastine (ASTELIN) 0.1 % nasal spray, Place into both  nostrils 2 (two) times daily. Use in each nostril as directed, Disp: , Rfl:    BREO ELLIPTA 200-25 MCG/INH AEPB, INL 1 PUFF PO QD, Disp: , Rfl: 5   desloratadine (CLARINEX) 5 MG tablet, Take 5 mg by mouth daily., Disp: , Rfl:    hydrochlorothiazide (HYDRODIURIL) 25 MG tablet, Take 1 tablet (25 mg total) by mouth daily., Disp: 90 tablet, Rfl: 1   losartan (COZAAR) 50 MG tablet, Take 1 tablet (50 mg total) by mouth daily., Disp: 90 tablet, Rfl: 1   Magnesium 250 MG TABS, Take by mouth. daily, Disp: , Rfl:    OVER THE COUNTER MEDICATION, Vitamin D3 25 mcg daily, Disp: , Rfl:    OVER THE COUNTER MEDICATION, Cranberry with vitamin C 15000 mg daily, Disp: , Rfl:   Review of Systems:  Constitutional: Denies fever, chills, diaphoresis, appetite change and fatigue.  HEENT: Denies photophobia, eye pain, redness, hearing loss, ear pain, congestion, sore throat, rhinorrhea, sneezing, mouth sores, trouble swallowing, neck pain, neck stiffness and tinnitus.   Respiratory: Denies SOB, DOE, cough, chest tightness,  and wheezing.   Cardiovascular: Denies chest pain, palpitations and leg swelling.  Gastrointestinal: Denies nausea, vomiting, abdominal pain, diarrhea, constipation, blood in stool and abdominal distention.  Genitourinary: Denies dysuria, urgency, frequency, hematuria, flank pain and difficulty urinating.  Endocrine: Denies: hot or cold intolerance, sweats, changes in hair or nails, polyuria, polydipsia. Musculoskeletal: Denies myalgias, back pain, joint swelling, arthralgias  and gait problem.  Skin: Denies pallor, rash and wound.  Neurological: Denies dizziness, seizures, syncope, weakness, light-headedness, numbness and headaches.  Hematological: Denies adenopathy. Easy bruising, personal or family bleeding history  Psychiatric/Behavioral: Denies suicidal ideation, mood changes, confusion, nervousness, sleep disturbance and agitation    Physical Exam: Vitals:   07/28/21 0710  BP: (!) 138/96   Pulse: 66  Temp: 98.7 F (37.1 C)  TempSrc: Oral  SpO2: 97%  Weight: 188 lb 12.8 oz (85.6 kg)    Body mass index is 30.47 kg/m.   Constitutional: NAD, calm, comfortable Eyes: PERRL, lids and conjunctivae normal ENMT: Mucous membranes are moist.  Respiratory: clear to auscultation bilaterally, no wheezing, no crackles. Normal respiratory effort. No accessory muscle use.  Cardiovascular: Regular rate and rhythm, no murmurs / rubs / gallops. No extremity edema.  Neurologic: Grossly intact and nonfocal Psychiatric: Normal judgment and insight. Alert and oriented x 3. Normal mood.    Impression and Plan:  Primary hypertension  - Plan: losartan (COZAAR) 50 MG tablet, hydrochlorothiazide (HYDRODIURIL) 25 MG tablet -Improved, but still not at goal.  Continue HCTZ 25 mg and add losartan 50 mg daily. -She will do ambulatory blood pressure monitoring and return in 6 weeks for follow-up.  Mixed hyperlipidemia -Improved, continue lifestyle changes.  Time spent: 31 minutes reviewing chart, interviewing and examining patient and formulating plan of care.   Patient Instructions  -Nice seeing you today!!  -Start losartan 50 mg daily.  -Schedule follow up in 6 weeks.    Lelon Frohlich, MD  [facility] at [facility]

## 2021-07-28 NOTE — Patient Instructions (Signed)
-  Nice seeing you today!!  -Start losartan 50 mg daily.  -Schedule follow up in 6 weeks.

## 2021-09-08 ENCOUNTER — Ambulatory Visit: Payer: Managed Care, Other (non HMO) | Admitting: Internal Medicine

## 2021-09-08 ENCOUNTER — Encounter: Payer: Self-pay | Admitting: Internal Medicine

## 2021-09-08 VITALS — BP 126/80 | HR 82 | Temp 98.1°F | Wt 189.7 lb

## 2021-09-08 DIAGNOSIS — I10 Essential (primary) hypertension: Secondary | ICD-10-CM | POA: Diagnosis not present

## 2021-09-08 NOTE — Progress Notes (Signed)
? ? ? ?Established Patient Office Visit ? ? ? ? ?This visit occurred during the SARS-CoV-2 public health emergency.  Safety protocols were in place, including screening questions prior to the visit, additional usage of staff PPE, and extensive cleaning of exam room while observing appropriate contact time as indicated for disinfecting solutions.  ? ? ?CC/Reason for Visit: Blood pressure follow-up ? ?HPI: Morgan Smith is a 56 y.o. female who is coming in today for the above mentioned reasons. Past Medical History is significant for: Hypertension, hyperlipidemia, asthma, seasonal allergies.  At last visit losartan 50 mg was added to hydrochlorothiazide 25 mg.  She has been adherent to medication therapy.  In office blood pressure today is 130/90.  At home her blood pressure was 126/80.  She has an annual follow-up in July with her nurse practitioner at work for an employee physical with lab work that is at no cost to her.  She is hesitant to have labs drawn in the office for that reason. ? ? ?Past Medical/Surgical History: ?Past Medical History:  ?Diagnosis Date  ? Allergy   ? Asthma   ? Chronic headaches   ? ? ?Past Surgical History:  ?Procedure Laterality Date  ? CHOLECYSTECTOMY    ? HERNIA REPAIR    ? infant  ? ? ?Social History: ? reports that she has never smoked. She has never used smokeless tobacco. She reports current alcohol use. She reports that she does not use drugs. ? ?Allergies: ?No Known Allergies ? ?Family History:  ?Family History  ?Problem Relation Age of Onset  ? Hypertension Maternal Aunt   ? Hypertension Maternal Grandmother   ? Breast cancer Neg Hx   ? ? ? ?Current Outpatient Medications:  ?  albuterol (VENTOLIN HFA) 108 (90 Base) MCG/ACT inhaler, Inhale into the lungs every 6 (six) hours as needed for wheezing or shortness of breath., Disp: , Rfl:  ?  azelastine (ASTELIN) 0.1 % nasal spray, Place into both nostrils 2 (two) times daily. Use in each nostril as directed, Disp: , Rfl:  ?   BREO ELLIPTA 200-25 MCG/INH AEPB, INL 1 PUFF PO QD, Disp: , Rfl: 5 ?  desloratadine (CLARINEX) 5 MG tablet, Take 5 mg by mouth daily., Disp: , Rfl:  ?  hydrochlorothiazide (HYDRODIURIL) 25 MG tablet, Take 1 tablet (25 mg total) by mouth daily., Disp: 90 tablet, Rfl: 1 ?  losartan (COZAAR) 50 MG tablet, Take 1 tablet (50 mg total) by mouth daily., Disp: 90 tablet, Rfl: 1 ?  Magnesium 250 MG TABS, Take by mouth. daily, Disp: , Rfl:  ?  OVER THE COUNTER MEDICATION, Vitamin D3 25 mcg daily, Disp: , Rfl:  ?  OVER THE COUNTER MEDICATION, Cranberry with vitamin C 15000 mg daily, Disp: , Rfl:  ? ?Review of Systems:  ?Constitutional: Denies fever, chills, diaphoresis, appetite change and fatigue.  ?HEENT: Denies photophobia, eye pain, redness, hearing loss, ear pain, congestion, sore throat, rhinorrhea, sneezing, mouth sores, trouble swallowing, neck pain, neck stiffness and tinnitus.   ?Respiratory: Denies SOB, DOE, cough, chest tightness,  and wheezing.   ?Cardiovascular: Denies chest pain, palpitations and leg swelling.  ?Gastrointestinal: Denies nausea, vomiting, abdominal pain, diarrhea, constipation, blood in stool and abdominal distention.  ?Genitourinary: Denies dysuria, urgency, frequency, hematuria, flank pain and difficulty urinating.  ?Endocrine: Denies: hot or cold intolerance, sweats, changes in hair or nails, polyuria, polydipsia. ?Musculoskeletal: Denies myalgias, back pain, joint swelling, arthralgias and gait problem.  ?Skin: Denies pallor, rash and wound.  ?Neurological: Denies dizziness,  seizures, syncope, weakness, light-headedness, numbness and headaches.  ?Hematological: Denies adenopathy. Easy bruising, personal or family bleeding history  ?Psychiatric/Behavioral: Denies suicidal ideation, mood changes, confusion, nervousness, sleep disturbance and agitation ? ? ? ?Physical Exam: ?Vitals:  ? 09/08/21 0713 09/08/21 0734  ?BP: 130/90 126/80  ?Pulse: 82   ?Temp: 98.1 ?F (36.7 ?C)   ?TempSrc: Oral    ?SpO2: 98%   ?Weight: 189 lb 11.2 oz (86 kg)   ? ? ?Body mass index is 30.62 kg/m?. ? ? ?Constitutional: NAD, calm, comfortable ?Eyes: PERRL, lids and conjunctivae normal ?ENMT: Mucous membranes are moist.  ?Respiratory: clear to auscultation bilaterally, no wheezing, no crackles. Normal respiratory effort. No accessory muscle use.  ?Cardiovascular: Regular rate and rhythm, no murmurs / rubs / gallops. No extremity edema.  ?Neurologic: Grossly intact and nonfocal ?Psychiatric: Normal judgment and insight. Alert and oriented x 3. Normal mood.  ? ? ?Impression and Plan: ? ?Primary hypertension ?-Even though office measurement today is elevated at 130/90, with home measurement of 126/80, I think it is okay to follow her at this time. ?-I have advised that she schedule a follow-up in 3 months with home ambulatory measurements. ? ?Time spent: 22 minutes reviewing chart, interviewing and examining patient and care. ? ? ? ? ? ?Lelon Frohlich, MD ?[facility] ? ? ?

## 2021-11-22 ENCOUNTER — Other Ambulatory Visit: Payer: Self-pay | Admitting: Internal Medicine

## 2021-11-22 DIAGNOSIS — I10 Essential (primary) hypertension: Secondary | ICD-10-CM

## 2021-12-11 ENCOUNTER — Encounter: Payer: Self-pay | Admitting: Internal Medicine

## 2021-12-11 ENCOUNTER — Ambulatory Visit: Payer: Managed Care, Other (non HMO) | Admitting: Internal Medicine

## 2021-12-11 VITALS — BP 125/76 | HR 70 | Temp 98.0°F | Wt 187.8 lb

## 2021-12-11 DIAGNOSIS — I10 Essential (primary) hypertension: Secondary | ICD-10-CM | POA: Diagnosis not present

## 2021-12-11 NOTE — Progress Notes (Signed)
Established Patient Office Visit     CC/Reason for Visit: Blood pressure follow-up  HPI: Morgan Smith is a 55 y.o. female who is coming in today for the above mentioned reasons. Past Medical History is significant for: Hypertension, hyperlipidemia, asthma and seasonal allergies.  For blood pressure she is being treated with hydrochlorothiazide 25 mg and losartan 50 mg daily.  She brings in ambulatory blood pressure measurements as below.  She is feeling well and has no acute concerns.  120/71 126/80 131/81 125/76 124/78  Past Medical/Surgical History: Past Medical History:  Diagnosis Date   Allergy    Asthma    Chronic headaches     Past Surgical History:  Procedure Laterality Date   CHOLECYSTECTOMY     HERNIA REPAIR     infant    Social History:  reports that she has never smoked. She has never used smokeless tobacco. She reports current alcohol use. She reports that she does not use drugs.  Allergies: No Known Allergies  Family History:  Family History  Problem Relation Age of Onset   Hypertension Maternal Aunt    Hypertension Maternal Grandmother    Breast cancer Neg Hx      Current Outpatient Medications:    albuterol (VENTOLIN HFA) 108 (90 Base) MCG/ACT inhaler, Inhale into the lungs every 6 (six) hours as needed for wheezing or shortness of breath., Disp: , Rfl:    azelastine (ASTELIN) 0.1 % nasal spray, Place into both nostrils 2 (two) times daily. Use in each nostril as directed, Disp: , Rfl:    desloratadine (CLARINEX) 5 MG tablet, Take 5 mg by mouth daily., Disp: , Rfl:    hydrochlorothiazide (HYDRODIURIL) 25 MG tablet, Take 1 tablet (25 mg total) by mouth daily., Disp: 90 tablet, Rfl: 1   levocetirizine (XYZAL) 5 MG tablet, Take 5 mg by mouth daily., Disp: , Rfl:    losartan (COZAAR) 50 MG tablet, TAKE 1 TABLET BY MOUTH EVERY DAY, Disp: 90 tablet, Rfl: 0   Magnesium 250 MG TABS, Take by mouth. daily, Disp: , Rfl:    montelukast (SINGULAIR)  10 MG tablet, Take 10 mg by mouth daily., Disp: , Rfl:    OVER THE COUNTER MEDICATION, Vitamin D3 25 mcg daily, Disp: , Rfl:    OVER THE COUNTER MEDICATION, Cranberry with vitamin C 50277 mg daily, Disp: , Rfl:    TRELEGY ELLIPTA 200-62.5-25 MCG/ACT AEPB, Inhale 1 puff into the lungs daily., Disp: , Rfl:   Review of Systems:  Constitutional: Denies fever, chills, diaphoresis, appetite change and fatigue.  HEENT: Denies photophobia, eye pain, redness, hearing loss, ear pain, congestion, sore throat, rhinorrhea, sneezing, mouth sores, trouble swallowing, neck pain, neck stiffness and tinnitus.   Respiratory: Denies SOB, DOE, cough, chest tightness,  and wheezing.   Cardiovascular: Denies chest pain, palpitations and leg swelling.  Gastrointestinal: Denies nausea, vomiting, abdominal pain, diarrhea, constipation, blood in stool and abdominal distention.  Genitourinary: Denies dysuria, urgency, frequency, hematuria, flank pain and difficulty urinating.  Endocrine: Denies: hot or cold intolerance, sweats, changes in hair or nails, polyuria, polydipsia. Musculoskeletal: Denies myalgias, back pain, joint swelling, arthralgias and gait problem.  Skin: Denies pallor, rash and wound.  Neurological: Denies dizziness, seizures, syncope, weakness, light-headedness, numbness and headaches.  Hematological: Denies adenopathy. Easy bruising, personal or family bleeding history  Psychiatric/Behavioral: Denies suicidal ideation, mood changes, confusion, nervousness, sleep disturbance and agitation    Physical Exam: Vitals:   12/11/21 0742 12/11/21 0755  BP: 110/90 125/76  Pulse: 70   Temp: 98 F (36.7 C)   TempSrc: Oral   SpO2: 96%   Weight: 187 lb 12.8 oz (85.2 kg)     Body mass index is 30.31 kg/m.   Constitutional: NAD, calm, comfortable Eyes: PERRL, lids and conjunctivae normal ENMT: Mucous membranes are moist.  Respiratory: clear to auscultation bilaterally, no wheezing, no crackles. Normal  respiratory effort. No accessory muscle use.  Cardiovascular: Regular rate and rhythm, no murmurs / rubs / gallops. No extremity edema.  Psychiatric: Normal judgment and insight. Alert and oriented x 3. Normal mood.    Impression and Plan:  Primary hypertension -Even though in office diastolic blood pressure is a little elevated, ambulatory measurements demonstrate good control. -Continue current regimen. -She will forward the results of her free labs that she will get through work in July.   Time spent:23 minutes reviewing chart, interviewing and examining patient and formulating plan of care.    Chaya Jan, MD [facility]

## 2022-01-03 LAB — TSH: TSH: 1.12 (ref 0.41–5.90)

## 2022-01-03 LAB — LIPID PANEL
Cholesterol: 225 — AB (ref 0–200)
HDL: 65 (ref 35–70)
LDL Cholesterol: 143

## 2022-01-03 LAB — COMPREHENSIVE METABOLIC PANEL: eGFR: 53

## 2022-01-03 LAB — VITAMIN D 25 HYDROXY (VIT D DEFICIENCY, FRACTURES): Vit D, 25-Hydroxy: 51.9

## 2022-01-03 LAB — CBC AND DIFFERENTIAL
Hemoglobin: 13.8 (ref 12.0–16.0)
Platelets: 256 10*3/uL (ref 150–400)
WBC: 5.6

## 2022-01-03 LAB — BASIC METABOLIC PANEL: Creatinine: 1.2 — AB (ref 0.5–1.1)

## 2022-04-12 ENCOUNTER — Other Ambulatory Visit: Payer: Self-pay | Admitting: Internal Medicine

## 2022-04-12 DIAGNOSIS — I10 Essential (primary) hypertension: Secondary | ICD-10-CM

## 2022-04-23 ENCOUNTER — Other Ambulatory Visit: Payer: Self-pay | Admitting: Internal Medicine

## 2022-04-23 DIAGNOSIS — I10 Essential (primary) hypertension: Secondary | ICD-10-CM

## 2022-06-22 ENCOUNTER — Ambulatory Visit: Payer: Managed Care, Other (non HMO) | Admitting: Internal Medicine

## 2022-06-22 VITALS — BP 120/80 | HR 82 | Temp 98.8°F | Wt 190.0 lb

## 2022-06-22 DIAGNOSIS — E559 Vitamin D deficiency, unspecified: Secondary | ICD-10-CM | POA: Diagnosis not present

## 2022-06-22 DIAGNOSIS — I10 Essential (primary) hypertension: Secondary | ICD-10-CM | POA: Diagnosis not present

## 2022-06-22 DIAGNOSIS — N182 Chronic kidney disease, stage 2 (mild): Secondary | ICD-10-CM | POA: Diagnosis not present

## 2022-06-22 DIAGNOSIS — E782 Mixed hyperlipidemia: Secondary | ICD-10-CM

## 2022-06-22 DIAGNOSIS — Z23 Encounter for immunization: Secondary | ICD-10-CM | POA: Diagnosis not present

## 2022-06-22 NOTE — Progress Notes (Signed)
Established Patient Office Visit     CC/Reason for Visit: Follow-up chronic conditions  HPI: Morgan Smith is a 56 y.o. female who is coming in today for the above mentioned reasons. Past Medical History is significant for: Hypertension, hyperlipidemia, asthma, seasonal allergies.  She is doing well, has no acute concerns or complaints.  She brings in outside labs done in July 2023.   Past Medical/Surgical History: Past Medical History:  Diagnosis Date   Allergy    Asthma    Chronic headaches     Past Surgical History:  Procedure Laterality Date   CHOLECYSTECTOMY     HERNIA REPAIR     infant    Social History:  reports that she has never smoked. She has never used smokeless tobacco. She reports current alcohol use. She reports that she does not use drugs.  Allergies: No Known Allergies  Family History:  Family History  Problem Relation Age of Onset   Hypertension Maternal Aunt    Hypertension Maternal Grandmother    Breast cancer Neg Hx      Current Outpatient Medications:    albuterol (VENTOLIN HFA) 108 (90 Base) MCG/ACT inhaler, Inhale into the lungs every 6 (six) hours as needed for wheezing or shortness of breath., Disp: , Rfl:    azelastine (ASTELIN) 0.1 % nasal spray, Place into both nostrils 2 (two) times daily. Use in each nostril as directed, Disp: , Rfl:    desloratadine (CLARINEX) 5 MG tablet, Take 5 mg by mouth daily., Disp: , Rfl:    dextromethorphan-guaiFENesin (MUCINEX DM) 30-600 MG 12hr tablet, Take 1 tablet by mouth 2 (two) times daily., Disp: , Rfl:    hydrochlorothiazide (HYDRODIURIL) 25 MG tablet, Take 1 tablet (25 mg total) by mouth daily., Disp: 90 tablet, Rfl: 1   levocetirizine (XYZAL) 5 MG tablet, Take 5 mg by mouth daily., Disp: , Rfl:    losartan (COZAAR) 50 MG tablet, TAKE 1 TABLET BY MOUTH EVERY DAY, Disp: 90 tablet, Rfl: 0   Magnesium 250 MG TABS, Take by mouth. daily, Disp: , Rfl:    montelukast (SINGULAIR) 10 MG tablet,  Take 10 mg by mouth daily., Disp: , Rfl:    OVER THE COUNTER MEDICATION, Vitamin D3 25 mcg daily, Disp: , Rfl:    OVER THE COUNTER MEDICATION, Cranberry with vitamin C 15000 mg daily, Disp: , Rfl:    TRELEGY ELLIPTA 200-62.5-25 MCG/ACT AEPB, Inhale 1 puff into the lungs daily., Disp: , Rfl:   Review of Systems:  Constitutional: Denies fever, chills, diaphoresis, appetite change and fatigue.  HEENT: Denies photophobia, eye pain, redness, hearing loss, ear pain, congestion, sore throat, rhinorrhea, sneezing, mouth sores, trouble swallowing, neck pain, neck stiffness and tinnitus.   Respiratory: Denies SOB, DOE, cough, chest tightness,  and wheezing.   Cardiovascular: Denies chest pain, palpitations and leg swelling.  Gastrointestinal: Denies nausea, vomiting, abdominal pain, diarrhea, constipation, blood in stool and abdominal distention.  Genitourinary: Denies dysuria, urgency, frequency, hematuria, flank pain and difficulty urinating.  Endocrine: Denies: hot or cold intolerance, sweats, changes in hair or nails, polyuria, polydipsia. Musculoskeletal: Denies myalgias, back pain, joint swelling, arthralgias and gait problem.  Skin: Denies pallor, rash and wound.  Neurological: Denies dizziness, seizures, syncope, weakness, light-headedness, numbness and headaches.  Hematological: Denies adenopathy. Easy bruising, personal or family bleeding history  Psychiatric/Behavioral: Denies suicidal ideation, mood changes, confusion, nervousness, sleep disturbance and agitation    Physical Exam: Vitals:   06/22/22 0708  BP: 120/80  Pulse: 82  Temp: 98.8 F (37.1 C)  TempSrc: Oral  SpO2: 94%  Weight: 190 lb (86.2 kg)    Body mass index is 30.67 kg/m.   Constitutional: NAD, calm, comfortable Eyes: PERRL, lids and conjunctivae normal ENMT: Mucous membranes are moist.  Respiratory: clear to auscultation bilaterally, no wheezing, no crackles. Normal respiratory effort. No accessory muscle use.   Cardiovascular: Regular rate and rhythm, no murmurs / rubs / gallops. No extremity edema.   Psychiatric: Normal judgment and insight. Alert and oriented x 3. Normal mood.     Impression and Plan:  Flu vaccine need - Plan: Flu Vaccine QUAD 6+ mos PF IM (Fluarix Quad PF)  Primary hypertension  Mixed hyperlipidemia  CKD (chronic kidney disease) stage 2, GFR 60-89 ml/min  Vitamin D deficiency   -Flu vaccine in office today. -Blood pressure is well-controlled. -Chronic kidney disease is at baseline, she is at a stage II-III. -Outside labs reviewed, notable for an LDL cholesterol of 143 with a total cholesterol of 225.  She does not want to be on statin medication and is instead going to work on lifestyle changes. -We have talked about updating all cancer screening and immunizations.  She declines all today.  Time spent:31 minutes reviewing chart, interviewing and examining patient and formulating plan of care.       Chaya Jan, MD [facility]

## 2022-10-29 ENCOUNTER — Ambulatory Visit: Payer: Managed Care, Other (non HMO) | Admitting: Internal Medicine

## 2022-10-29 ENCOUNTER — Encounter: Payer: Self-pay | Admitting: Internal Medicine

## 2022-10-29 VITALS — BP 114/75 | HR 75 | Temp 98.3°F | Wt 193.1 lb

## 2022-10-29 DIAGNOSIS — N182 Chronic kidney disease, stage 2 (mild): Secondary | ICD-10-CM

## 2022-10-29 DIAGNOSIS — E782 Mixed hyperlipidemia: Secondary | ICD-10-CM

## 2022-10-29 DIAGNOSIS — I10 Essential (primary) hypertension: Secondary | ICD-10-CM

## 2022-10-29 DIAGNOSIS — J4540 Moderate persistent asthma, uncomplicated: Secondary | ICD-10-CM

## 2022-10-29 NOTE — Assessment & Plan Note (Signed)
Baseline creatinine remains around 1.2-1.3.

## 2022-10-29 NOTE — Assessment & Plan Note (Addendum)
Blood pressure is well-controlled.  Current medications include losartan 50 mg and hydrochlorothiazide 25 mg daily.

## 2022-10-29 NOTE — Progress Notes (Signed)
Established Patient Office Visit     CC/Reason for Visit: Follow-up chronic conditions  HPI: Morgan Smith is a 57 y.o. female who is coming in today for the above mentioned reasons. Past Medical History is significant for: Hypertension, hyperlipidemia, asthma, seasonal allergies, prior vitamin D deficiency.  She is feeling well and has no acute concerns.  She had labs at work with improved cholesterol panel, see below.  Blood pressure is noted to be elevated in office today, however home measurements are well within range: 120/80, 114/75, 119/73, 122/79, 115/75.   Past Medical/Surgical History: Past Medical History:  Diagnosis Date   Allergy    Asthma    Chronic headaches     Past Surgical History:  Procedure Laterality Date   CHOLECYSTECTOMY     HERNIA REPAIR     infant    Social History:  reports that she has never smoked. She has never used smokeless tobacco. She reports current alcohol use. She reports that she does not use drugs.  Allergies: No Known Allergies  Family History:  Family History  Problem Relation Age of Onset   Hypertension Maternal Aunt    Hypertension Maternal Grandmother    Breast cancer Neg Hx      Current Outpatient Medications:    albuterol (VENTOLIN HFA) 108 (90 Base) MCG/ACT inhaler, Inhale into the lungs every 6 (six) hours as needed for wheezing or shortness of breath., Disp: , Rfl:    azelastine (ASTELIN) 0.1 % nasal spray, Place into both nostrils 2 (two) times daily. Use in each nostril as directed, Disp: , Rfl:    desloratadine (CLARINEX) 5 MG tablet, Take 5 mg by mouth daily., Disp: , Rfl:    dextromethorphan-guaiFENesin (MUCINEX DM) 30-600 MG 12hr tablet, Take 1 tablet by mouth 2 (two) times daily., Disp: , Rfl:    hydrochlorothiazide (HYDRODIURIL) 25 MG tablet, Take 1 tablet (25 mg total) by mouth daily., Disp: 90 tablet, Rfl: 1   levocetirizine (XYZAL) 5 MG tablet, Take 5 mg by mouth daily., Disp: , Rfl:    losartan  (COZAAR) 50 MG tablet, TAKE 1 TABLET BY MOUTH EVERY DAY, Disp: 90 tablet, Rfl: 0   Magnesium 250 MG TABS, Take by mouth. daily, Disp: , Rfl:    montelukast (SINGULAIR) 10 MG tablet, Take 10 mg by mouth daily., Disp: , Rfl:    OVER THE COUNTER MEDICATION, Vitamin D3 25 mcg daily, Disp: , Rfl:    OVER THE COUNTER MEDICATION, Cranberry with vitamin C 40981 mg daily, Disp: , Rfl:    TRELEGY ELLIPTA 200-62.5-25 MCG/ACT AEPB, Inhale 1 puff into the lungs daily., Disp: , Rfl:   Review of Systems:  Negative unless indicated in HPI.   Physical Exam: Vitals:   10/29/22 1421 10/29/22 1428 10/29/22 1449  BP: (!) 150/100 (!) 124/103 114/75  Pulse: 75    Temp: 98.3 F (36.8 C)    TempSrc: Oral    Weight: 193 lb 1.6 oz (87.6 kg)      Body mass index is 31.17 kg/m.   Physical Exam Vitals reviewed.  Constitutional:      Appearance: Normal appearance.  HENT:     Head: Normocephalic and atraumatic.  Eyes:     Conjunctiva/sclera: Conjunctivae normal.     Pupils: Pupils are equal, round, and reactive to light.  Cardiovascular:     Rate and Rhythm: Normal rate and regular rhythm.  Pulmonary:     Effort: Pulmonary effort is normal.     Breath sounds:  Normal breath sounds.  Skin:    General: Skin is warm and dry.  Neurological:     General: No focal deficit present.     Mental Status: She is alert and oriented to person, place, and time.  Psychiatric:        Mood and Affect: Mood normal.        Behavior: Behavior normal.        Thought Content: Thought content normal.        Judgment: Judgment normal.      Impression and Plan:  Primary hypertension Assessment & Plan: Blood pressure is well-controlled.  Current medications include losartan 50 mg and hydrochlorothiazide 25 mg daily.   Mixed hyperlipidemia Assessment & Plan: Cholesterol panel has improved as compared to 3 months ago.  Most recent panel on 2/22 with total cholesterol of 275, triglycerides 61, HDL 81 and LDL 115.   She is managing with lifestyle changes, would like to avoid statin if possible.   CKD (chronic kidney disease) stage 2, GFR 60-89 ml/min Assessment & Plan: Baseline creatinine remains around 1.2-1.3.   Moderate persistent asthma without complication     Time spent:32 minutes reviewing chart, interviewing and examining patient and formulating plan of care.     Chaya Jan, MD [facility] at [facility]

## 2022-10-29 NOTE — Assessment & Plan Note (Signed)
Cholesterol panel has improved as compared to 3 months ago.  Most recent panel on 2/22 with total cholesterol of 275, triglycerides 61, HDL 81 and LDL 115.  She is managing with lifestyle changes, would like to avoid statin if possible.

## 2022-11-04 ENCOUNTER — Telehealth: Payer: Self-pay | Admitting: Internal Medicine

## 2022-11-04 NOTE — Telephone Encounter (Signed)
Patient is aware 

## 2022-11-04 NOTE — Telephone Encounter (Signed)
Requesting an excuse from jury duty, says her allergies are bad and she doesn't want to be in the room with a bunch of people sneezing and coughing.

## 2023-04-06 IMAGING — MG MM DIGITAL SCREENING BILAT W/ TOMO AND CAD
8 series · 8 of 24 positions shown · non-contrast
Comparison: None.

CLINICAL DATA: Screening.

EXAM:
DIGITAL SCREENING BILATERAL MAMMOGRAM WITH TOMOSYNTHESIS AND CAD
TECHNIQUE: Bilateral screening digital craniocaudal and mediolateral oblique
mammograms were obtained. Bilateral screening digital breast
tomosynthesis was performed. The images were evaluated with
computer-aided detection.

[R CC synth-2D]
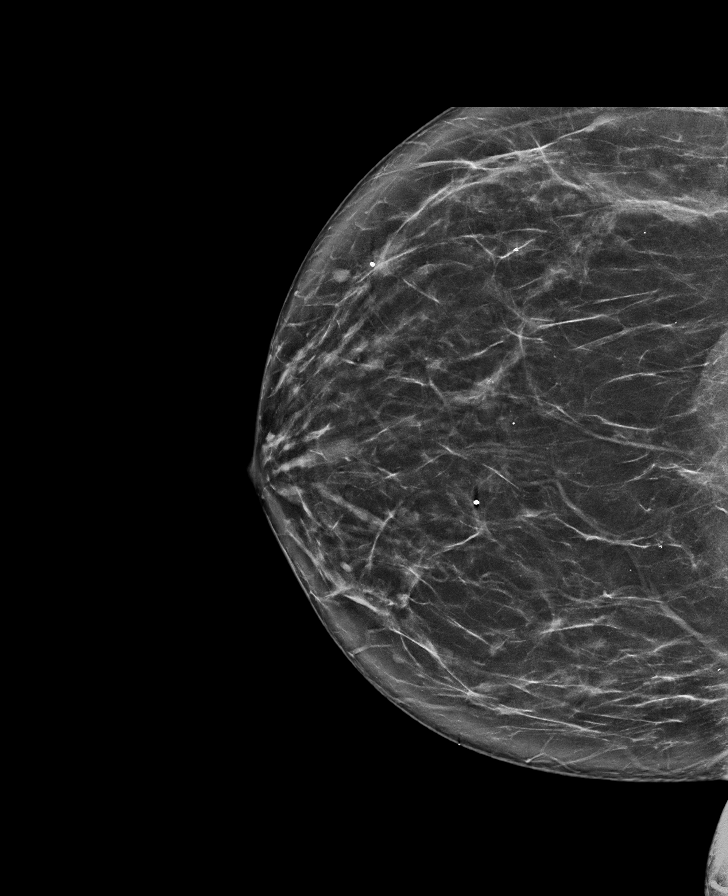

[R MLO synth-2D]
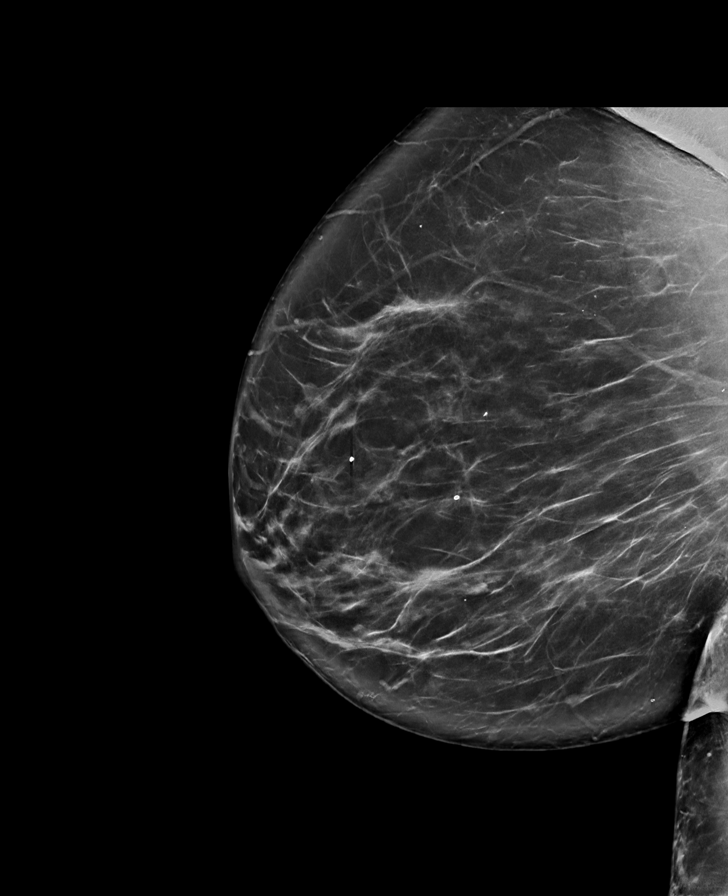

[L CC synth-2D]
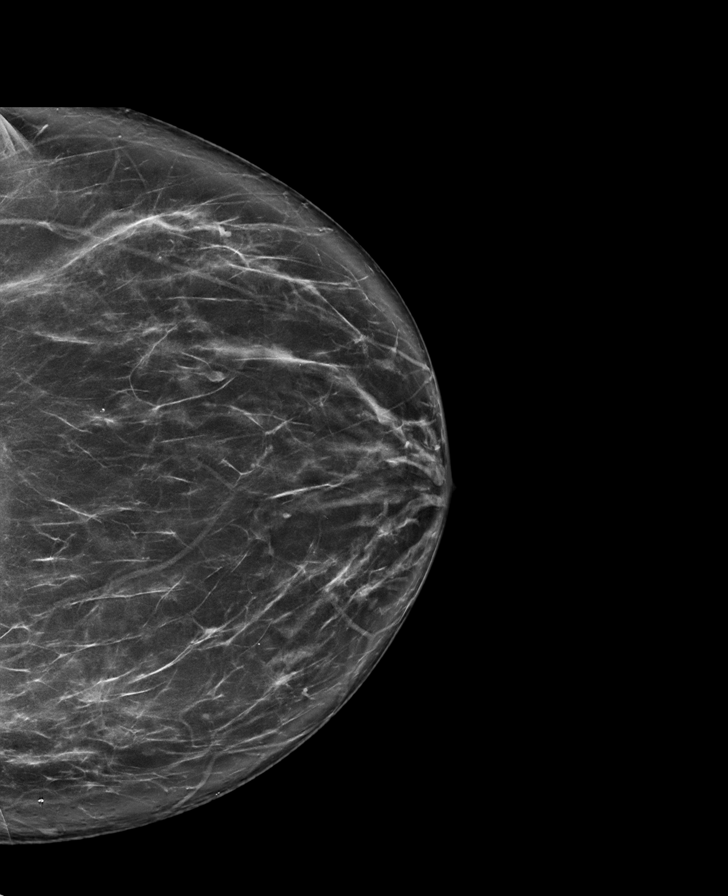

[L MLO synth-2D]
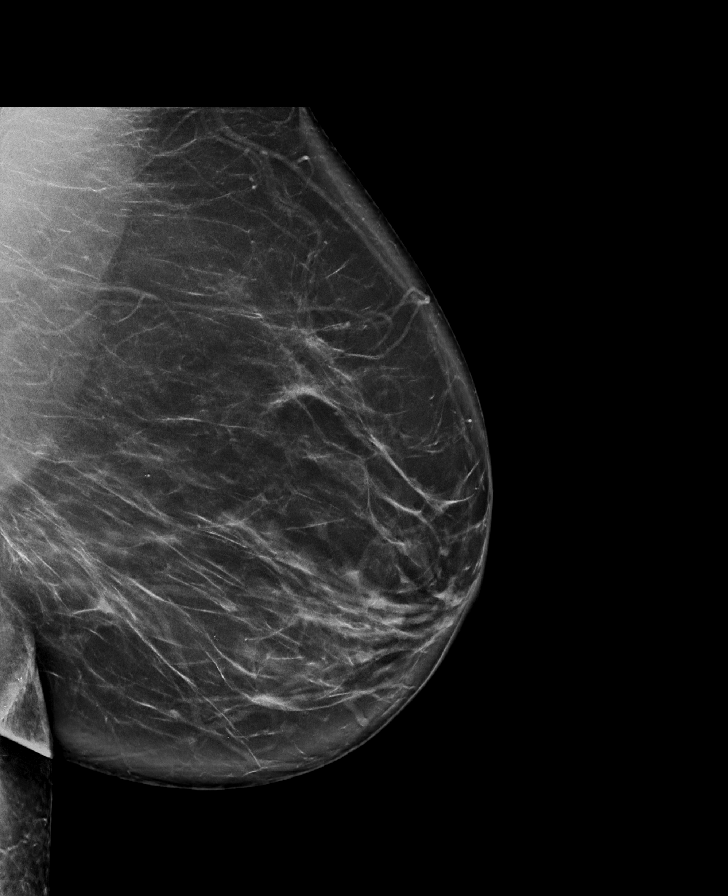

[R CC tomo · tomo slice 43/85.0]
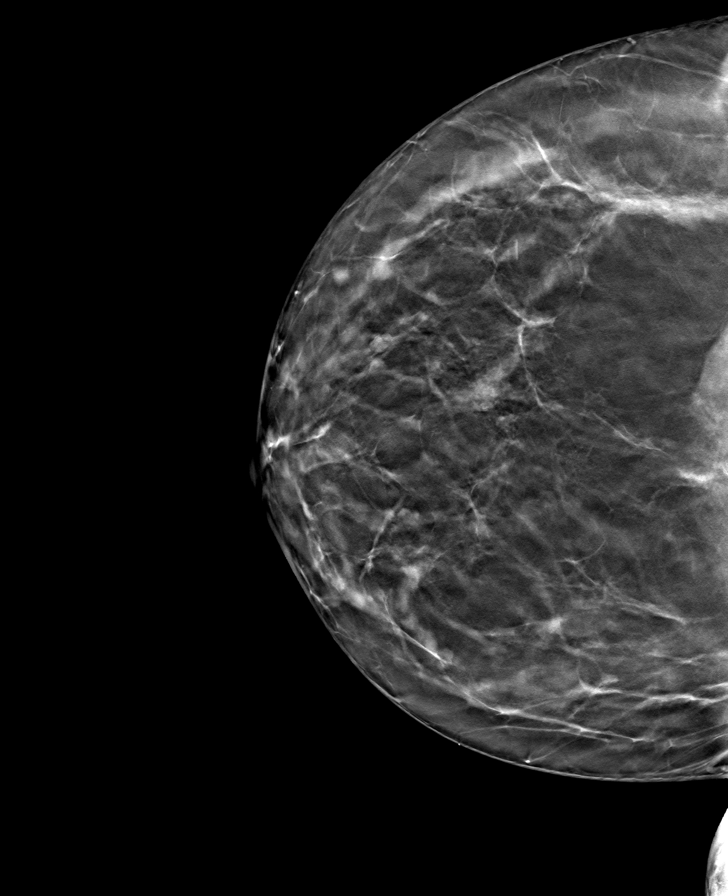

[L MLO tomo · tomo slice 51/101.0]
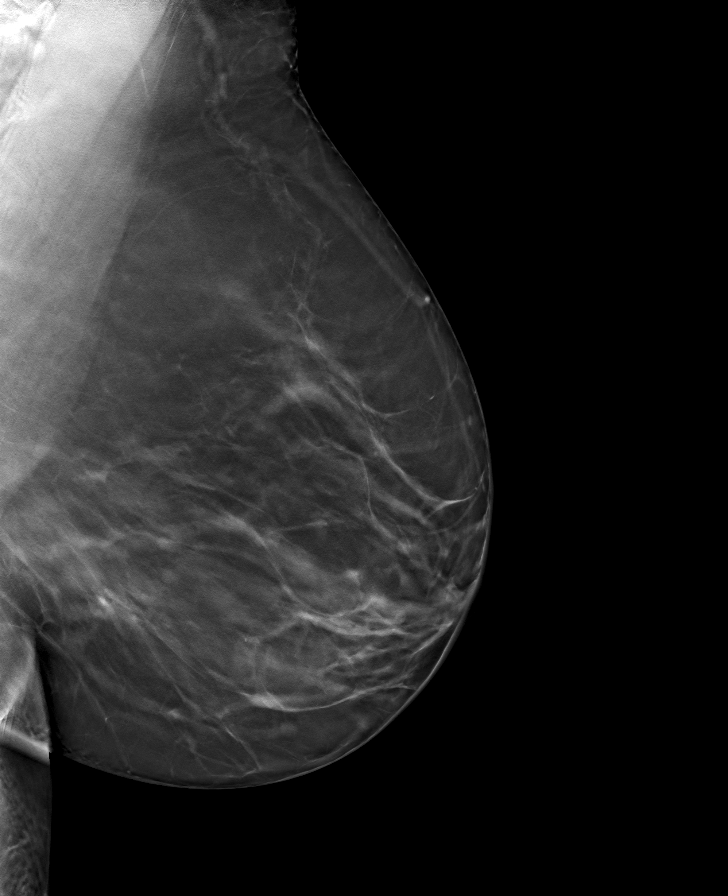

[L CC tomo · tomo slice 45/89.0]
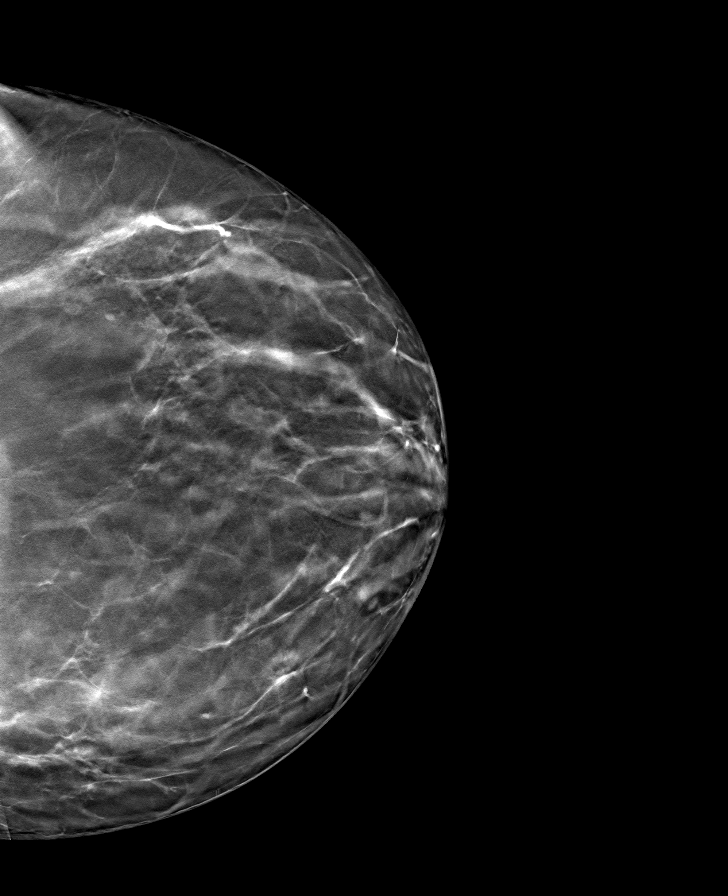

[R MLO tomo · tomo slice 49/97.0]
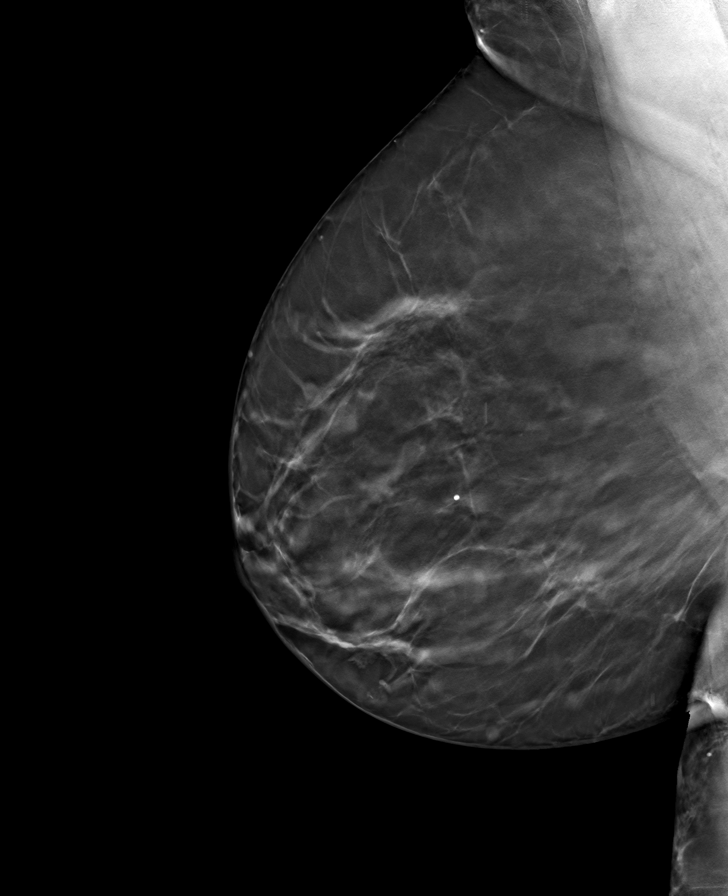

[8 of 24 positions shown; findings below may reference images not displayed]

ACR Breast Density Category b: There are scattered areas of
fibroglandular density.
FINDINGS: There are no findings suspicious for malignancy.
IMPRESSION: No mammographic evidence of malignancy. A result letter of this
screening mammogram will be mailed directly to the patient.

RECOMMENDATION:
Screening mammogram in one year. (Code:XG-X-X7B)

BI-RADS CATEGORY  1: Negative.

## 2023-04-29 ENCOUNTER — Ambulatory Visit: Payer: Managed Care, Other (non HMO) | Admitting: Internal Medicine

## 2023-05-17 ENCOUNTER — Ambulatory Visit: Payer: Managed Care, Other (non HMO) | Admitting: Internal Medicine

## 2023-09-01 LAB — BASIC METABOLIC PANEL WITH GFR
Creatinine: 1.1 (ref 0.5–1.1)
Potassium: 4.3 meq/L (ref 3.5–5.1)
Sodium: 144 (ref 137–147)

## 2023-09-01 LAB — LIPID PANEL
Cholesterol: 238 — AB (ref 0–200)
HDL: 77 — AB (ref 35–70)
LDL Cholesterol: 143
Triglycerides: 105 (ref 40–160)

## 2023-09-01 LAB — HEPATIC FUNCTION PANEL
ALT: 18 U/L (ref 7–35)
AST: 21 (ref 13–35)
Alkaline Phosphatase: 101 (ref 25–125)

## 2023-09-01 LAB — COMPREHENSIVE METABOLIC PANEL WITH GFR: Calcium: 10.3 (ref 8.7–10.7)

## 2023-09-07 ENCOUNTER — Ambulatory Visit: Payer: Managed Care, Other (non HMO) | Admitting: Internal Medicine

## 2023-10-25 ENCOUNTER — Other Ambulatory Visit: Payer: Self-pay | Admitting: Internal Medicine

## 2023-10-25 DIAGNOSIS — Z1231 Encounter for screening mammogram for malignant neoplasm of breast: Secondary | ICD-10-CM

## 2023-10-27 ENCOUNTER — Ambulatory Visit
Admission: RE | Admit: 2023-10-27 | Discharge: 2023-10-27 | Discharge status: Home / Self Care | Source: Ambulatory Visit | Attending: Internal Medicine | Admitting: Internal Medicine

## 2023-10-27 DIAGNOSIS — Z1231 Encounter for screening mammogram for malignant neoplasm of breast: Secondary | ICD-10-CM

## 2023-10-28 ENCOUNTER — Ambulatory Visit: Payer: Managed Care, Other (non HMO) | Admitting: Internal Medicine

## 2023-10-28 ENCOUNTER — Encounter: Payer: Self-pay | Admitting: Internal Medicine

## 2023-10-28 VITALS — BP 140/96 | HR 75 | Temp 98.8°F | Wt 188.5 lb

## 2023-10-28 DIAGNOSIS — I10 Essential (primary) hypertension: Secondary | ICD-10-CM | POA: Diagnosis not present

## 2023-10-28 DIAGNOSIS — N1831 Chronic kidney disease, stage 3a: Secondary | ICD-10-CM

## 2023-10-28 DIAGNOSIS — E782 Mixed hyperlipidemia: Secondary | ICD-10-CM | POA: Diagnosis not present

## 2023-10-28 NOTE — Progress Notes (Signed)
 Established Patient Office Visit     CC/Reason for Visit: Follow-up chronic conditions  HPI: Morgan Smith is a 58 y.o. female who is coming in today for the above mentioned reasons. Past Medical History is significant for: Hypertension, hyperlipidemia, chronic kidney disease stage IIIa, obesity.  Feeling well without acute concerns or complaints.  Her blood pressure is again noted to be elevated in office today.  She does not bring home measurements but states they have been around the same values.  As usual she remains hesitant to increase hypertension medication or start a statin.  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  I have abstracted some labs today.   Past Medical/Surgical History: Past Medical History:  Diagnosis Date   Allergy    Asthma    Chronic headaches     Past Surgical History:  Procedure Laterality Date   CHOLECYSTECTOMY     HERNIA REPAIR     infant    Social History:  reports that she has never smoked. She has never used smokeless tobacco. She reports current alcohol use. She reports that she does not use drugs.  Allergies: No Known Allergies  Family History:  Family History  Problem Relation Age of Onset   Hypertension Maternal Aunt    Hypertension Maternal Grandmother    Breast cancer Neg Hx      Current Outpatient Medications:    albuterol  (VENTOLIN  HFA) 108 (90 Base) MCG/ACT inhaler, Inhale into the lungs every 6 (six) hours as needed for wheezing or shortness of breath., Disp: , Rfl:    azelastine (ASTELIN) 0.1 % nasal spray, Place into both nostrils 2 (two) times daily. Use in each nostril as directed, Disp: , Rfl:    desloratadine (CLARINEX) 5 MG tablet, Take 5 mg by mouth daily., Disp: , Rfl:    dextromethorphan-guaiFENesin (MUCINEX DM) 30-600 MG 12hr tablet, Take 1 tablet by mouth 2 (two) times daily., Disp: , Rfl:    hydrochlorothiazide  (HYDRODIURIL ) 25 MG tablet, Take 1 tablet (25  mg total) by mouth daily., Disp: 90 tablet, Rfl: 1   levocetirizine (XYZAL) 5 MG tablet, Take 5 mg by mouth daily., Disp: , Rfl:    losartan  (COZAAR ) 50 MG tablet, TAKE 1 TABLET BY MOUTH EVERY DAY, Disp: 90 tablet, Rfl: 0   Magnesium 250 MG TABS, Take by mouth. daily, Disp: , Rfl:    montelukast (SINGULAIR) 10 MG tablet, Take 10 mg by mouth daily., Disp: , Rfl:    OVER THE COUNTER MEDICATION, Vitamin D3 25 mcg daily, Disp: , Rfl:    OVER THE COUNTER MEDICATION, Cranberry with vitamin C 15000 mg daily, Disp: , Rfl:    TRELEGY ELLIPTA 200-62.5-25 MCG/ACT AEPB, Inhale 1 puff into the lungs daily., Disp: , Rfl:   Review of Systems:  Negative unless indicated in HPI.   Physical Exam: Vitals:   10/28/23 1451  BP: (!) 140/96  Pulse: 75  Temp: 98.8 F (37.1 C)  TempSrc: Oral  SpO2: 96%  Weight: 188 lb 8 oz (85.5 kg)    Body mass index is 30.42 kg/m.   Physical Exam Vitals reviewed.  Constitutional:      Appearance: Normal appearance. She is obese.  HENT:     Head: Normocephalic and atraumatic.  Eyes:     Conjunctiva/sclera: Conjunctivae normal.  Cardiovascular:     Rate and Rhythm: Normal rate and regular rhythm.  Pulmonary:     Effort: Pulmonary effort  is normal.     Breath sounds: Normal breath sounds.  Skin:    General: Skin is warm and dry.  Neurological:     General: No focal deficit present.     Mental Status: She is alert and oriented to person, place, and time.  Psychiatric:        Mood and Affect: Mood normal.        Behavior: Behavior normal.        Thought Content: Thought content normal.        Judgment: Judgment normal.      Impression and Plan:  Primary hypertension  Mixed hyperlipidemia  Stage 3a chronic kidney disease (HCC)   - I have again given her my recommendations for starting a statin and adjusting blood pressure medication.  She defers at this time.  Time spent:30 minutes reviewing chart, interviewing and examining patient and  formulating plan of care.     Marguerita Shih, MD [facility]
